# Patient Record
Sex: Male | Born: 1955
Health system: Southern US, Community
[De-identification: ages and names within clinical notes are randomized; demographics above are authoritative.]

## PROBLEM LIST (undated history)

## (undated) DIAGNOSIS — I10 Essential (primary) hypertension: Secondary | ICD-10-CM

## (undated) DIAGNOSIS — K219 Gastro-esophageal reflux disease without esophagitis: Secondary | ICD-10-CM

## (undated) DIAGNOSIS — M1990 Unspecified osteoarthritis, unspecified site: Secondary | ICD-10-CM

## (undated) DIAGNOSIS — A159 Respiratory tuberculosis unspecified: Secondary | ICD-10-CM

## (undated) HISTORY — DX: Unspecified osteoarthritis, unspecified site: M19.90

## (undated) HISTORY — DX: Respiratory tuberculosis unspecified: A15.9

## (undated) HISTORY — DX: Gastro-esophageal reflux disease without esophagitis: K21.9

## (undated) HISTORY — DX: Essential (primary) hypertension: I10

---

## 2007-05-29 ENCOUNTER — Encounter (INDEPENDENT_AMBULATORY_CARE_PROVIDER_SITE_OTHER): Payer: Self-pay | Admitting: *Deleted

## 2007-06-18 ENCOUNTER — Ambulatory Visit: Payer: Self-pay | Admitting: Internal Medicine

## 2007-06-18 DIAGNOSIS — I10 Essential (primary) hypertension: Secondary | ICD-10-CM | POA: Insufficient documentation

## 2007-06-25 ENCOUNTER — Ambulatory Visit: Payer: Self-pay | Admitting: Internal Medicine

## 2007-06-28 LAB — CONVERTED CEMR LAB
AST: 34 units/L (ref 0–37)
Basophils Relative: 1.2 % — ABNORMAL HIGH (ref 0.0–1.0)
CO2: 33 meq/L — ABNORMAL HIGH (ref 19–32)
Calcium: 9.3 mg/dL (ref 8.4–10.5)
Creatinine, Ser: 1 mg/dL (ref 0.4–1.5)
Eosinophils Relative: 4.7 % (ref 0.0–5.0)
GFR calc Af Amer: 102 mL/min
Glucose, Bld: 103 mg/dL — ABNORMAL HIGH (ref 70–99)
Hemoglobin: 15.1 g/dL (ref 13.0–17.0)
Lymphocytes Relative: 41.9 % (ref 12.0–46.0)
Monocytes Absolute: 0.5 10*3/uL (ref 0.2–0.7)
Neutro Abs: 2.3 10*3/uL (ref 1.4–7.7)
PSA: 0.53 ng/mL (ref 0.10–4.00)
Potassium: 3.7 meq/L (ref 3.5–5.1)
RDW: 12.7 % (ref 11.5–14.6)
TSH: 1.53 microintl units/mL (ref 0.35–5.50)
Total CHOL/HDL Ratio: 7.5
VLDL: 34 mg/dL (ref 0–40)
WBC: 5.3 10*3/uL (ref 4.5–10.5)

## 2007-07-17 ENCOUNTER — Ambulatory Visit: Payer: Self-pay | Admitting: Gastroenterology

## 2007-07-26 ENCOUNTER — Encounter: Payer: Self-pay | Admitting: Internal Medicine

## 2007-07-26 ENCOUNTER — Ambulatory Visit: Payer: Self-pay | Admitting: Gastroenterology

## 2007-09-23 ENCOUNTER — Ambulatory Visit: Payer: Self-pay | Admitting: Internal Medicine

## 2008-03-24 ENCOUNTER — Encounter (INDEPENDENT_AMBULATORY_CARE_PROVIDER_SITE_OTHER): Payer: Self-pay | Admitting: *Deleted

## 2008-04-29 ENCOUNTER — Ambulatory Visit: Payer: Self-pay | Admitting: Internal Medicine

## 2008-04-29 DIAGNOSIS — D239 Other benign neoplasm of skin, unspecified: Secondary | ICD-10-CM | POA: Insufficient documentation

## 2008-05-04 ENCOUNTER — Encounter (INDEPENDENT_AMBULATORY_CARE_PROVIDER_SITE_OTHER): Payer: Self-pay | Admitting: *Deleted

## 2008-06-03 ENCOUNTER — Ambulatory Visit: Payer: Self-pay | Admitting: Family Medicine

## 2008-06-03 DIAGNOSIS — L723 Sebaceous cyst: Secondary | ICD-10-CM | POA: Insufficient documentation

## 2008-06-03 LAB — CONVERTED CEMR LAB
Basophils Relative: 0.3 % (ref 0.0–3.0)
Eosinophils Absolute: 0.3 10*3/uL (ref 0.0–0.7)
Eosinophils Relative: 4.7 % (ref 0.0–5.0)
Lymphocytes Relative: 43.8 % (ref 12.0–46.0)
MCV: 95.8 fL (ref 78.0–100.0)
Monocytes Relative: 9.9 % (ref 3.0–12.0)
Neutrophils Relative %: 41.3 % — ABNORMAL LOW (ref 43.0–77.0)
Platelets: 230 10*3/uL (ref 150–400)
RBC: 4.75 M/uL (ref 4.22–5.81)
WBC: 5.6 10*3/uL (ref 4.5–10.5)

## 2008-06-04 ENCOUNTER — Encounter (INDEPENDENT_AMBULATORY_CARE_PROVIDER_SITE_OTHER): Payer: Self-pay | Admitting: *Deleted

## 2008-07-03 ENCOUNTER — Ambulatory Visit: Payer: Self-pay | Admitting: Internal Medicine

## 2008-07-03 DIAGNOSIS — M25559 Pain in unspecified hip: Secondary | ICD-10-CM

## 2008-07-03 DIAGNOSIS — G8929 Other chronic pain: Secondary | ICD-10-CM | POA: Insufficient documentation

## 2008-07-05 LAB — CONVERTED CEMR LAB
AST: 30 units/L (ref 0–37)
CO2: 34 meq/L — ABNORMAL HIGH (ref 19–32)
Calcium: 9.3 mg/dL (ref 8.4–10.5)
Chloride: 105 meq/L (ref 96–112)
Glucose, Bld: 98 mg/dL (ref 70–99)
HDL: 30.3 mg/dL — ABNORMAL LOW (ref >39.00)
LDL Cholesterol: 144 mg/dL — ABNORMAL HIGH (ref 0–99)
PSA: 0.54 ng/mL (ref 0.10–4.00)
Potassium: 4.4 meq/L (ref 3.5–5.1)
Sodium: 142 meq/L (ref 135–145)
Total CHOL/HDL Ratio: 7
Triglycerides: 120 mg/dL (ref 0.0–149.0)
VLDL: 24 mg/dL (ref 0.0–40.0)

## 2008-07-06 ENCOUNTER — Encounter (INDEPENDENT_AMBULATORY_CARE_PROVIDER_SITE_OTHER): Payer: Self-pay | Admitting: *Deleted

## 2008-11-13 ENCOUNTER — Telehealth (INDEPENDENT_AMBULATORY_CARE_PROVIDER_SITE_OTHER): Payer: Self-pay | Admitting: *Deleted

## 2009-06-29 ENCOUNTER — Telehealth: Payer: Self-pay | Admitting: Gastroenterology

## 2009-12-16 ENCOUNTER — Ambulatory Visit: Payer: Self-pay | Admitting: Genetic Counselor

## 2010-05-10 NOTE — Progress Notes (Signed)
Summary: Change recall  Phone Note From Other Clinic   Summary of Call: Dr. Celene Skeen called to inform me that this patient has a brother with colon cancer at age 52. We will change his screening recall to 5 years, 07/2012. Initial call taken by: Claudette Head, MD  Follow-up for Phone Call        Changed recall in IDX and in EMR.  Follow-up by: Christie Nottingham CMA Duncan Dull),  June 29, 2009 1:47 PM     Appended Document: Change recall    Clinical Lists Changes  Observations: Added new observation of COLONNXTDUE: 07/2012 (06/29/2009 13:48)

## 2012-06-24 ENCOUNTER — Ambulatory Visit (INDEPENDENT_AMBULATORY_CARE_PROVIDER_SITE_OTHER): Payer: 59 | Admitting: Family Medicine

## 2012-06-24 ENCOUNTER — Ambulatory Visit: Payer: 59

## 2012-06-24 VITALS — BP 140/86 | HR 66 | Temp 98.3°F | Resp 16 | Ht 67.5 in | Wt 199.8 lb

## 2012-06-24 DIAGNOSIS — R059 Cough, unspecified: Secondary | ICD-10-CM

## 2012-06-24 DIAGNOSIS — R05 Cough: Secondary | ICD-10-CM

## 2012-06-24 DIAGNOSIS — J209 Acute bronchitis, unspecified: Secondary | ICD-10-CM

## 2012-06-24 LAB — POCT CBC
Granulocyte percent: 39.3 %G (ref 37–80)
HCT, POC: 43.9 % (ref 43.5–53.7)
Hemoglobin: 14.2 g/dL (ref 14.1–18.1)
Lymph, poc: 3.1 (ref 0.6–3.4)
MCHC: 32.3 g/dL (ref 31.8–35.4)
MPV: 9 fL (ref 0–99.8)
POC Granulocyte: 2.4 (ref 2–6.9)
POC MID %: 8.5 %M (ref 0–12)

## 2012-06-24 MED ORDER — ALBUTEROL SULFATE HFA 108 (90 BASE) MCG/ACT IN AERS: 2.0000 | INHALATION_SPRAY | RESPIRATORY_TRACT | Status: DC | PRN

## 2012-06-24 MED ORDER — AZITHROMYCIN 250 MG PO TABS: ORAL_TABLET | ORAL | Status: DC

## 2012-06-24 MED ORDER — HYDROCODONE-HOMATROPINE 5-1.5 MG/5ML PO SYRP: 5.0000 mL | ORAL_SOLUTION | Freq: Four times a day (QID) | ORAL | Status: DC | PRN

## 2012-06-24 NOTE — Progress Notes (Signed)
  Subjective:    Patient ID: Aaron Bowman, male    DOB: [birth year], 57 y.o.   MRN: [id number]  HPI   Lots of sweats, no chills, unsure about fevers. No SHoB, wheeze or stridor.  A lot of nasal congestion, lots of sore thraot.  Using any otc meds wife gives him.       Review of Systems     Objective:   Physical Exam  Pulmonary/Chest: No accessory muscle usage. Not tachypneic. No respiratory distress. He has no decreased breath sounds. He has wheezes in the right lower field and the left lower field. He has rhonchi in the right upper field and the right lower field. He has rales in the right lower field and the left lower field.      Results for orders placed in visit on 06/24/12  POCT CBC      Result Value Range   WBC 6.0  4.6 - 10.2 K/uL   Lymph, poc 3.1  0.6 - 3.4   POC LYMPH PERCENT 52.2 (*) 10 - 50 %L   MID (cbc) 0.5  0 - 0.9   POC MID % 8.5  0 - 12 %M   POC Granulocyte 2.4  2 - 6.9   Granulocyte percent 39.3  37 - 80 %G   RBC 4.55 (*) 4.69 - 6.13 M/uL   Hemoglobin 14.2  14.1 - 18.1 g/dL   HCT, POC 40.9  81.1 - 53.7 %   MCV 96.4  80 - 97 fL   MCH, POC 31.2  27 - 31.2 pg   MCHC 32.3  31.8 - 35.4 g/dL   RDW, POC 91.4     Platelet Count, POC 237  142 - 424 K/uL   MPV 9.0  0 - 99.8 fL    [facility] reading (PRIMARY) by  Dr. Clelia Croft.  Possible small amount of left lower lobe consolidation overlying heart.  Assessment & Plan:  Cough - Plan: DG Chest 2 View, POCT CBC  Acute bronchitis  Meds ordered this encounter  Medications  . lisinopril-hydrochlorothiazide (PRINZIDE,ZESTORETIC) 20-25 MG per tablet    Sig: Take 1 tablet by mouth daily.  Marland Kitchen azithromycin (ZITHROMAX) 250 MG tablet    Sig: Take 2 tabs PO x 1 dose, then 1 tab PO QD x 4 days    Dispense:  6 tablet    Refill:  0  . albuterol (PROVENTIL HFA;VENTOLIN HFA) 108 (90 BASE) MCG/ACT inhaler    Sig: Inhale 2 puffs into the lungs every 4 (four) hours as needed for wheezing (cough, shortness of breath or wheezing.).   Dispense:  1 Inhaler    Refill:  1  . HYDROcodone-homatropine (HYCODAN) 5-1.5 MG/5ML syrup    Sig: Take 5 mLs by mouth every 6 (six) hours as needed for cough.    Dispense:  180 mL    Refill:  0

## 2012-06-24 NOTE — Patient Instructions (Addendum)

## 2012-07-18 ENCOUNTER — Encounter: Payer: Self-pay | Admitting: Gastroenterology

## 2014-07-07 ENCOUNTER — Other Ambulatory Visit (HOSPITAL_COMMUNITY): Payer: Self-pay | Admitting: Orthopedic Surgery

## 2014-07-07 DIAGNOSIS — M25561 Pain in right knee: Secondary | ICD-10-CM

## 2014-07-13 ENCOUNTER — Ambulatory Visit (HOSPITAL_COMMUNITY)
Admission: RE | Admit: 2014-07-13 | Discharge: 2014-07-13 | Disposition: A | Discharge status: Home / Self Care | Payer: 59 | Source: Ambulatory Visit | Attending: Orthopedic Surgery | Admitting: Orthopedic Surgery

## 2014-07-13 DIAGNOSIS — M6281 Muscle weakness (generalized): Secondary | ICD-10-CM | POA: Insufficient documentation

## 2014-07-13 DIAGNOSIS — M25561 Pain in right knee: Secondary | ICD-10-CM | POA: Diagnosis present

## 2015-04-08 ENCOUNTER — Ambulatory Visit (INDEPENDENT_AMBULATORY_CARE_PROVIDER_SITE_OTHER): Payer: 59 | Admitting: Family Medicine

## 2015-04-08 VITALS — BP 150/100 | HR 93 | Temp 99.4°F | Resp 17 | Ht 68.0 in | Wt 201.8 lb

## 2015-04-08 DIAGNOSIS — R5381 Other malaise: Secondary | ICD-10-CM

## 2015-04-08 DIAGNOSIS — J0110 Acute frontal sinusitis, unspecified: Secondary | ICD-10-CM

## 2015-04-08 DIAGNOSIS — R509 Fever, unspecified: Secondary | ICD-10-CM | POA: Diagnosis not present

## 2015-04-08 DIAGNOSIS — R05 Cough: Secondary | ICD-10-CM

## 2015-04-08 DIAGNOSIS — J3489 Other specified disorders of nose and nasal sinuses: Secondary | ICD-10-CM

## 2015-04-08 DIAGNOSIS — R059 Cough, unspecified: Secondary | ICD-10-CM

## 2015-04-08 LAB — POCT INFLUENZA A/B
Influenza A, POC: NEGATIVE
Influenza B, POC: NEGATIVE

## 2015-04-08 MED ORDER — CEFDINIR 300 MG PO CAPS: 300.0000 mg | ORAL_CAPSULE | Freq: Two times a day (BID) | ORAL | Status: DC

## 2015-04-08 MED ORDER — HYDROCODONE-HOMATROPINE 5-1.5 MG/5ML PO SYRP: 5.0000 mL | ORAL_SOLUTION | Freq: Three times a day (TID) | ORAL | Status: DC | PRN

## 2015-04-08 NOTE — Patient Instructions (Signed)
It appears that you have a sinus infection We are going to treat you with omnicef- this is an antibiotic that treats both sinus and chest infections Also use the cough syrup as needed- remember this will make you sleepy so do not use if you need to drive!

## 2015-04-08 NOTE — Progress Notes (Signed)
[facility] [address] [place] [address] [phone]  Date:  04/08/2015   Name:  Aaron Bowman   DOB:  February 12, 1956   MRN:  [id number]  PCP:  Kathlene November, MD    Chief Complaint: Facial Pain; Cough; and Flu Vaccine   History of Present Illness:  Aaron Bowman is a 59 y.o. very pleasant male patient who presents with the following:  Established pt here today with illness- he notes a sinus headache, cough.  Fatigue, he has not noted a fever at home.   He has not noted any body aches.   He has noted some chills.  No GI symptoms   He is generally in good health except for HTN- he has been using sudafed which is likely why his BP is up today  BP Readings from Last 3 Encounters:  04/08/15 150/100  06/24/12 140/86  07/03/08 140/92     Patient Active Problem List   Diagnosis Date Noted  . HIP PAIN, RIGHT 07/03/2008  . SEBACEOUS CYST, NECK 06/03/2008  . MOLE 04/29/2008  . HYPERTENSION 06/18/2007    History reviewed. No pertinent past medical history.  History reviewed. No pertinent past surgical history.  Social History  Substance Use Topics  . Smoking status: Never Smoker   . Smokeless tobacco: None  . Alcohol Use: None    History reviewed. No pertinent family history.  No Known Allergies  Medication list has been reviewed and updated.  Current Outpatient Prescriptions on File Prior to Visit  Medication Sig Dispense Refill  . lisinopril-hydrochlorothiazide (PRINZIDE,ZESTORETIC) 20-25 MG per tablet Take 1 tablet by mouth daily.    Marland Kitchen albuterol (PROVENTIL HFA;VENTOLIN HFA) 108 (90 BASE) MCG/ACT inhaler Inhale 2 puffs into the lungs every 4 (four) hours as needed for wheezing (cough, shortness of breath or wheezing.). (Patient not taking: Reported on 04/08/2015) 1 Inhaler 1  . azithromycin (ZITHROMAX) 250 MG tablet Take 2 tabs PO x 1 dose, then 1 tab PO QD x 4 days (Patient not taking: Reported on 04/08/2015) 6 tablet 0  . HYDROcodone-homatropine  (HYCODAN) 5-1.5 MG/5ML syrup Take 5 mLs by mouth every 6 (six) hours as needed for cough. (Patient not taking: Reported on 04/08/2015) 180 mL 0   No current facility-administered medications on file prior to visit.    Review of Systems:  As per HPI- otherwise negative.   Physical Examination: Filed Vitals:   04/08/15 0809 04/08/15 0810  BP: 160/98 150/100  Pulse: 93   Temp: 99.4 F (37.4 C)   Resp: 17    Filed Vitals:   04/08/15 0809  Height: 5\' 8"  (1.727 m)  Weight: 201 lb 12.8 oz (91.536 kg)   Body mass index is 30.69 kg/(m^2). Ideal Body Weight: Weight in (lb) to have BMI = 25: 164.1  GEN: WDWN, NAD, Non-toxic, A & O x 3, overweight, appears mildly ill.  Mild fever HEENT: Atraumatic, Normocephalic. Neck supple. No masses, No LAD.  Bilateral TM wnl, oropharynx normal.  PEERL,EOMI.   Nasal cavity is congested and inflamed  Ears and Nose: No external deformity. CV: RRR, No M/G/R. No JVD. No thrill. No extra heart sounds. PULM: CTA B, no wheezes, crackles, rhonchi. No retractions. No resp. distress. No accessory muscle use. ABD: S, NT, ND EXTR: No c/c/e NEURO Normal gait.  PSYCH: Normally interactive. Conversant. Not depressed or anxious appearing.  Calm demeanor.   Results for orders placed or performed in visit on 04/08/15  POCT Influenza A/B  Result Value Ref  Range   Influenza A, POC Negative Negative   Influenza B, POC Negative Negative     Assessment and Plan: Acute frontal sinusitis, recurrence not specified - Plan: cefdinir (OMNICEF) 300 MG capsule  Fever, unspecified - Plan: POCT Influenza A/B  Cough - Plan: POCT Influenza A/B, HYDROcodone-homatropine (HYCODAN) 5-1.5 MG/5ML syrup  Malaise - Plan: POCT Influenza A/B  Sinus pain - Plan: POCT Influenza A/B  Treat with omnicef and hycodan as needed Defer flu shot until well as he has a fever  Let me know if not feeling better soon Signed Lamar Blinks, MD

## 2015-08-25 DIAGNOSIS — E538 Deficiency of other specified B group vitamins: Secondary | ICD-10-CM | POA: Diagnosis not present

## 2015-08-25 DIAGNOSIS — Z0000 Encounter for general adult medical examination without abnormal findings: Secondary | ICD-10-CM | POA: Diagnosis not present

## 2015-08-25 DIAGNOSIS — Z125 Encounter for screening for malignant neoplasm of prostate: Secondary | ICD-10-CM | POA: Diagnosis not present

## 2015-08-31 DIAGNOSIS — H6121 Impacted cerumen, right ear: Secondary | ICD-10-CM | POA: Diagnosis not present

## 2015-08-31 DIAGNOSIS — M7711 Lateral epicondylitis, right elbow: Secondary | ICD-10-CM | POA: Diagnosis not present

## 2015-08-31 DIAGNOSIS — E785 Hyperlipidemia, unspecified: Secondary | ICD-10-CM | POA: Diagnosis not present

## 2015-08-31 DIAGNOSIS — E538 Deficiency of other specified B group vitamins: Secondary | ICD-10-CM | POA: Diagnosis not present

## 2015-08-31 DIAGNOSIS — I10 Essential (primary) hypertension: Secondary | ICD-10-CM | POA: Diagnosis not present

## 2015-08-31 DIAGNOSIS — Z0000 Encounter for general adult medical examination without abnormal findings: Secondary | ICD-10-CM | POA: Diagnosis not present

## 2015-09-21 DIAGNOSIS — M25462 Effusion, left knee: Secondary | ICD-10-CM | POA: Diagnosis not present

## 2015-09-21 DIAGNOSIS — S8002XA Contusion of left knee, initial encounter: Secondary | ICD-10-CM | POA: Diagnosis not present

## 2015-10-14 DIAGNOSIS — Z125 Encounter for screening for malignant neoplasm of prostate: Secondary | ICD-10-CM | POA: Diagnosis not present

## 2015-10-15 DIAGNOSIS — M25561 Pain in right knee: Secondary | ICD-10-CM | POA: Diagnosis not present

## 2015-10-15 DIAGNOSIS — M25461 Effusion, right knee: Secondary | ICD-10-CM | POA: Diagnosis not present

## 2015-10-19 DIAGNOSIS — N400 Enlarged prostate without lower urinary tract symptoms: Secondary | ICD-10-CM | POA: Diagnosis not present

## 2015-10-29 DIAGNOSIS — M25561 Pain in right knee: Secondary | ICD-10-CM | POA: Diagnosis not present

## 2015-11-02 DIAGNOSIS — S8002XD Contusion of left knee, subsequent encounter: Secondary | ICD-10-CM | POA: Diagnosis not present

## 2015-11-02 DIAGNOSIS — M25561 Pain in right knee: Secondary | ICD-10-CM | POA: Diagnosis not present

## 2015-11-17 DIAGNOSIS — H40023 Open angle with borderline findings, high risk, bilateral: Secondary | ICD-10-CM | POA: Diagnosis not present

## 2015-11-17 DIAGNOSIS — Z83511 Family history of glaucoma: Secondary | ICD-10-CM | POA: Diagnosis not present

## 2015-12-20 DIAGNOSIS — H40023 Open angle with borderline findings, high risk, bilateral: Secondary | ICD-10-CM | POA: Diagnosis not present

## 2015-12-20 DIAGNOSIS — Z83511 Family history of glaucoma: Secondary | ICD-10-CM | POA: Diagnosis not present

## 2015-12-21 DIAGNOSIS — S8002XD Contusion of left knee, subsequent encounter: Secondary | ICD-10-CM | POA: Diagnosis not present

## 2015-12-21 DIAGNOSIS — S83412D Sprain of medial collateral ligament of left knee, subsequent encounter: Secondary | ICD-10-CM | POA: Diagnosis not present

## 2016-01-06 ENCOUNTER — Ambulatory Visit (INDEPENDENT_AMBULATORY_CARE_PROVIDER_SITE_OTHER): Payer: 59 | Admitting: Urgent Care

## 2016-01-06 VITALS — BP 138/80 | HR 69 | Temp 98.5°F | Resp 16 | Ht 68.0 in | Wt 200.2 lb

## 2016-01-06 DIAGNOSIS — M545 Low back pain, unspecified: Secondary | ICD-10-CM

## 2016-01-06 DIAGNOSIS — M79604 Pain in right leg: Secondary | ICD-10-CM

## 2016-01-06 DIAGNOSIS — M62830 Muscle spasm of back: Secondary | ICD-10-CM

## 2016-01-06 DIAGNOSIS — Z8739 Personal history of other diseases of the musculoskeletal system and connective tissue: Secondary | ICD-10-CM

## 2016-01-06 MED ORDER — CYCLOBENZAPRINE HCL 5 MG PO TABS: 5.0000 mg | ORAL_TABLET | Freq: Three times a day (TID) | ORAL | 1 refills | Status: DC | PRN

## 2016-01-06 MED ORDER — NAPROXEN SODIUM 550 MG PO TABS: 550.0000 mg | ORAL_TABLET | Freq: Two times a day (BID) | ORAL | 1 refills | Status: DC

## 2016-01-06 NOTE — Progress Notes (Signed)
    MRN: [id number] DOB: 09/01/1955  Subjective:   Aaron Bowman is a 60 y.o. male presenting for chief complaint of Extremity Weakness (rt leg numb)  Reports 3 week history of right thigh numbness and tingling that travels through calf and foot. Has not tried any medications. Hydrates very well, 2+ liters of water daily. Diet is healthy. Patient works standing for an entire 8 hour shift, works 40 hour shifts, works indoors. Denies trauma, falls, redness, swelling, warmth, pain, cramp, back pain, heavy lifting.   Aaron Bowman has a current medication list which includes the following prescription(s): cholecalciferol, ferrous sulfate, lisinopril-hydrochlorothiazide, fish oil, vitamin b-12, vitamin e, and aspirin ec. Also has No Known Allergies.  Aaron Bowman  has no past medical history on file. Also  has no past surgical history on file.  Objective:   Vitals: BP 138/80 (BP Location: Right Arm, Patient Position: Sitting, Cuff Size: Normal)   Pulse 69   Temp 98.5 F (36.9 C) (Oral)   Resp 16   Ht '5\' 8"'$  (1.727 m)   Wt 200 lb 3.2 oz (90.8 kg)   SpO2 99%   BMI 30.44 kg/m   Physical Exam  Constitutional: He is oriented to person, place, and time. He appears well-developed and well-nourished.  Cardiovascular: Normal rate.   Pulmonary/Chest: Effort normal.  Musculoskeletal:       Lumbar back: He exhibits decreased range of motion (flexion), tenderness (over area depicted) and spasm (over bilateral lumbar paraspinal muscles). He exhibits no bony tenderness, no swelling, no edema, no deformity and no laceration.       Back:  Negative SLR.  Neurological: He is alert and oriented to person, place, and time.   Assessment and Plan :   1. Right leg pain 2. Right-sided low back pain without sciatica 3. Back spasm 4. History of scoliosis - Patient's symptom set is consistent with sciatica. I discussed obtaining labs such as CK, ESR, cbc, A1c as well as low back x-ray. He declined these for now, will use  conservative management including back stretches. Follow up in 2 weeks if no improvement. Consider PT, short steroid course, labs, x-ray, referral to ortho at that point.   Jaynee Eagles, PA-C [facility] [phone] 01/06/2016 4:43 PM

## 2016-01-06 NOTE — Patient Instructions (Addendum)
Ciática  °(Sciatica) ° La ciática es el dolor, debilidad, entumecimiento u hormigueo a lo largo del nervio ciático. El nervio comienza en la zona inferior de la espalda y desciende por la parte posterior de cada pierna. El nervio controla los músculos de la parte inferior de la pierna y de la zona posterior de la rodilla, y transmite la sensibilidad a la parte posterior del muslo, la pierna y la planta del pie. La ciática es un síntoma de otras afecciones médicas. Por ejemplo, un daño a los nervios o algunas enfermedades como un disco herniado o un espolón óseo en la columna vertebral, podrían dañarle o presionar en el nervio ciático. Esto causa dolor, debilidad y otras sensaciones normalmente asociadas con la ciática. Generalmente la ciática afecta sólo un lado del cuerpo. °CAUSAS  °· Disco herniado o desplazado. °· Enfermedad degenerativa del disco. °· Un síndrome doloroso que compromete un músculo angosto de los glúteos (síndrome piriforme). °· Lesión o fractura pélvica. °· Embarazo. °· Tumor (casos raros). °SÍNTOMAS  °Los síntomas pueden variar de leves a muy graves. Por lo general, los síntomas descienden desde la zona lumbar a las nalgas y la parte posterior de la pierna. Ellos son:  °· Hormigueo leve o dolor sordo en la parte inferior de la espalda, la pierna o la cadera. °· Adormecimiento en la parte posterior de la pantorrilla o la planta del pie. °· Sensación de quemazón en la zona lumbar, la pierna o la cadera. °· Dolor agudo en la zona inferior de la espalda, la pierna o la cadera. °· Debilidad en las piernas. °· Dolor de espalda intenso que inhibe los movimientos. °Los síntomas pueden empeorar al toser, estornudar, reír o estar sentado o parado durante mucho tiempo. Además, el sobrepeso puede empeorar los síntomas.  °DIAGNÓSTICO  °Su médico le hará un examen físico para buscar los síntomas comunes de la ciática. Le pedirá que haga algunos movimientos o actividades que activarían el dolor del nervio  ciático. Para encontrar las causas de la ciática podrá indicarle otros estudios. Estos pueden ser:  °· Análisis de sangre. °· Radiografías. °· Pruebas de diagnóstico por imágenes, como resonancia magnética o tomografía computada. °TRATAMIENTO  °El tratamiento se dirige a las causas de la ciática. A veces, el tratamiento no es necesario, y el dolor y el malestar desaparecen por sí mismos. Si necesita tratamiento, su médico puede sugerir:  °· Medicamentos de venta libre para aliviar el dolor. °· Medicamentos recetados, como antiinflamatorios, relajantes musculares o narcóticos. °· Aplicación de calor o hielo en la zona del dolor. °· Inyecciones de corticoides para disminuir el dolor, la irritación y la inflamación alrededor del nervio. °· Reducción de la actividad en los períodos de dolor. °· Ejercicios y estiramiento del abdomen para fortalecer y mejorar la flexibilidad de la columna vertebral. Su médico puede sugerirle perder peso si el peso extra empeora el dolor de espalda. °· Fisioterapia. °· La cirugía para eliminar lo que presiona o pincha el nervio, como un espolón óseo o parte de una hernia de disco. °INSTRUCCIONES PARA EL CUIDADO EN EL HOGAR  °· Sólo tome medicamentos de venta libre o recetados para calmar el dolor o el malestar, según las indicaciones de su médico. °· Aplique hielo sobre el área dolorida durante 20 minutos 3-4 veces por día durante los primeras 48-72 horas. Luego intente aplicar calor de la misma manera. °· Haga ejercicios, elongue o realice sus actividades habituales, si no le causan más dolor. °· Cumpla con todas las sesiones de fisioterapia, según le   indique su mdico.  Cumpla con todas las visitas de control, segn le indique su mdico.  No use tacones altos o zapatos que no tengan buen apoyo.  Verifique que el colchn no sea muy blando. Un colchn firme Best boy y las [place]. SOLICITE ATENCIN MDICA DE INMEDIATO SI:   Pierde el control de la vejiga o del intestino  (incontinencia).  Aumenta la debilidad en la zona inferior de la espalda, la pelvis, las nalgas o las piernas.  Siente irritacin o inflamacin en la espalda.  Tiene sensacin de ardor al [employer].  El dolor empeora cuando se acuesta o lo despierta por la noche.  El dolor es peor del que experiment en el pasado.  Dura ms de 4 semanas.  Pierde peso sin motivo de [place] sbita. ASEGRESE DE QUE:   Comprende estas instrucciones.  Controlar su enfermedad.  Solicitar ayuda de inmediato si no mejora o si empeora.   Esta informacin no tiene Marine scientist el consejo del mdico. Asegrese de hacerle al mdico cualquier pregunta que tenga.   Document Released: 03/27/2005 Document Revised: 12/16/2014 Elsevier Interactive Patient Education [employer].     IF you received an x-ray today, you will receive an invoice from [facility]. Please contact [facility] at [phone] with questions or concerns regarding your invoice.   IF you received labwork today, you will receive an invoice from [employer]. Please contact [facility] at [phone] with questions or concerns regarding your invoice.   Our billing staff will not be able to assist you with questions regarding bills from these companies.  You will be contacted with the lab results as soon as they are available. The fastest way to get your results is to activate your My Chart account. Instructions are located on the last page of this paperwork. If you have not heard from [place] regarding the results in 2 weeks, please contact this office.

## 2016-04-20 ENCOUNTER — Ambulatory Visit (INDEPENDENT_AMBULATORY_CARE_PROVIDER_SITE_OTHER): Payer: 59

## 2016-04-20 ENCOUNTER — Ambulatory Visit (INDEPENDENT_AMBULATORY_CARE_PROVIDER_SITE_OTHER): Payer: 59 | Admitting: Urgent Care

## 2016-04-20 VITALS — BP 130/88 | HR 78 | Temp 97.8°F | Resp 18 | Ht 68.0 in | Wt 202.0 lb

## 2016-04-20 DIAGNOSIS — M488X9 Other specified spondylopathies, site unspecified: Secondary | ICD-10-CM

## 2016-04-20 DIAGNOSIS — R200 Anesthesia of skin: Secondary | ICD-10-CM | POA: Diagnosis not present

## 2016-04-20 DIAGNOSIS — Z23 Encounter for immunization: Secondary | ICD-10-CM

## 2016-04-20 DIAGNOSIS — Z8739 Personal history of other diseases of the musculoskeletal system and connective tissue: Secondary | ICD-10-CM

## 2016-04-20 DIAGNOSIS — M5416 Radiculopathy, lumbar region: Secondary | ICD-10-CM

## 2016-04-20 DIAGNOSIS — R202 Paresthesia of skin: Secondary | ICD-10-CM

## 2016-04-20 DIAGNOSIS — M5136 Other intervertebral disc degeneration, lumbar region: Secondary | ICD-10-CM

## 2016-04-20 DIAGNOSIS — M545 Low back pain: Secondary | ICD-10-CM | POA: Diagnosis not present

## 2016-04-20 DIAGNOSIS — M47819 Spondylosis without myelopathy or radiculopathy, site unspecified: Secondary | ICD-10-CM

## 2016-04-20 NOTE — Progress Notes (Signed)
MRN: [id number] DOB: 1955-11-03  Subjective:   Aaron Bowman is a 61 y.o. male presenting for chief complaint of Follow-up (Leg pain, Right, Foot, ) and Flu Vaccine  Reports ongoing numbness and tingling throughout his right leg, worst over medial aspect of his right foot. He is now having similar symptoms to his left leg. Also has bilateral numbness and tingling of both his hands, has cramping of both hands and feet. Denies back pain but admits history of scoliosis. Also has urinary frequency, drinks about 1/2 liter per day. Stands for 8 hour shifts, 40 hours per week. Denies trauma, falls, hematuria, dysuria, n/v, abdominal pain, constipation, weight loss, weakness, incontinence. Admits family history of diabetes with his mother. Patient tries to eat healthily. Denies blurred vision, polydipsia, skin infections that won't heal. Denies smoking cigarettes.  Aaron Bowman has a current medication list which includes the following prescription(s): aspirin ec, cholecalciferol, cyclobenzaprine, ferrous sulfate, lisinopril-hydrochlorothiazide, naproxen sodium, fish oil, vitamin b-12, and vitamin e. Also has No Known Allergies.  Aaron Bowman  has no past medical history on file. Also  has no past surgical history on file.  Objective:   Vitals: BP 130/88   Pulse 78   Temp 97.8 F (36.6 C) (Oral)   Resp 18   Ht 5\' 8"  (1.727 m)   Wt 202 lb (91.6 kg)   SpO2 97%   BMI 30.71 kg/m   Physical Exam  Constitutional: He is oriented to person, place, and time. He appears well-developed and well-nourished.  HENT:  Mouth/Throat: Oropharynx is clear and moist.  Eyes: Pupils are equal, round, and reactive to light. No scleral icterus.  Cardiovascular: Normal rate, regular rhythm and intact distal pulses.  Exam reveals no gallop and no friction rub.   No murmur heard. Pulmonary/Chest: No respiratory distress. He has no wheezes. He has no rales.  Abdominal: Soft. Bowel sounds are normal. He exhibits no distension  and no mass. There is no tenderness. There is no guarding.  Musculoskeletal:       Right upper leg: He exhibits tenderness (reports painful tightness of hamstring with straight leg raise). He exhibits no bony tenderness, no swelling, no edema, no deformity and no laceration.       Left upper leg: He exhibits no tenderness, no bony tenderness, no swelling, no edema, no deformity and no laceration.       Right lower leg: He exhibits no tenderness, no bony tenderness, no swelling, no edema, no deformity and no laceration.       Right foot: There is normal range of motion, no tenderness, no bony tenderness, no swelling, normal capillary refill, no crepitus, no deformity and no laceration.  Neurological: He is alert and oriented to person, place, and time. He displays normal reflexes. Coordination normal.  Skin: Skin is warm and dry.   Dg Lumbar Spine Complete  Result Date: 04/20/2016 CLINICAL DATA:  Low back pain for 3 months. EXAM: LUMBAR SPINE - COMPLETE 4+ VIEW COMPARISON:  None. FINDINGS: There is no evidence of lumbar spine fracture. Alignment is normal. There are degenerative joint changes with facet joint sclerosis of the mid to lower lumbar spine. Anterior osteophytosis is identified at L3. IMPRESSION: No acute fracture dislocation. Degenerative joint changes of lumbar spine. Electronically Signed   By: Abelardo Diesel M.D.   On: 04/20/2016 17:41   Assessment and Plan :   1. Lumbar radiculopathy 2. Numbness and tingling 3. Facet joint sclerosis ([place]) 4. Degenerative disc disease, lumbar 5. History of scoliosis -  I counseled patient on conservative measures for his symptoms. He declined PT for now. He would like to schedule APAP, use meloxicam for breakthrough pain. He plans on rtc if his symptoms persist. Consider steroid course, referral to PT or ortho. Labs pending.  6. Need for influenza vaccination - Flu Vaccine QUAD 36+ mos IM   Jaynee Eagles, [facility] [phone] 04/20/2016  4:57 PM

## 2016-04-20 NOTE — Patient Instructions (Addendum)
Tylenol Tome 500mg  cada 6 horas o tome 1,000mg  cada 8 horas para dolor y inflammacion associado con artritis.    Discopata degenerativa (Degenerative Disk Disease) La discopata degenerativa es una enfermedad causada por los cambios que se producen en los discos intervertebrales a medida que una persona envejece. Los discos intervertebrales son blandos y comprimibles, y se encuentran entre los huesos de la columna (vrtebras). Estos discos actan como amortiguadores de los [place]. La discopata degenerativa puede comprometer a toda la columna vertebral. Sin embargo, el cuello y parte inferior de la espalda son las zonas ms comnmente afectadas. Al envejecer, pueden producirse muchos cambios en los discos intervertebrales, por ejemplo:  Pueden secarse y encogerse.  Pueden formarse pequeos desgarros en el recubrimiento exterior duro del disco (anillo).  El espacio intervertebral puede reducirse debido a la prdida de [place].  Pueden desarrollarse crecimientos anormales en el hueso (espolones). Esto puede ejercer presin [employer] races nerviosas que salen del canal espinal y Engineer, production.  El canal espinal puede estrecharse. FACTORES DE RIESGO  Tener sobrepeso.  Tener antecedentes familiares de discopata degenerativa.  Fumar.  El riesgo es mayor si suele levantar objetos pesados o sufre una lesin repentina. [place] persona a otra y pueden incluir los siguientes:  Dolor de intensidad variable. Algunas personas no tienen dolor, mientras que otras sufren un dolor intenso. La ubicacin del dolor depende de la zona de la columna vertebral que est afectada.  Si se trata de un disco que se encuentra cerca de la zona del cuello, tendr dolor de cuello o del brazo.  Si la zona afectada es la parte inferior de la espalda, tendr dolor de [place], de los glteos o las piernas.  Dolor que empeora al agacharse, [employer] brazos o Optometrist  movimientos de torsin.  Dolor que puede comenzar de [place] gradual y Film/video editor con el paso del [place]. Tambin puede aparecer despus de sufrir una lesin leve o importante.  Hormigueo o adormecimiento de los brazos o las piernas. DIAGNSTICO El mdico le preguntar cules son sus sntomas y Estate manager/land agent las actividades y los hbitos que pueden causar Conservation officer, historic buildings. [place], [employer] y las enfermedades que tuvo o los tratamientos que recibi. El [employer] har un examen para determinar el rango de movimiento posible de la zona afectada, controlar la fuerza que tiene en las extremidades, as Therapist, occupational sensibilidad en las zonas de los brazos y las piernas inervadas por diferentes races nerviosas. Tambin se Educational psychologist los siguientes estudios:  Una radiografa de la columna.  Otros estudios por imgenes, como una RM. TRATAMIENTO El Sport and exercise psychologist cul es el mejor plan de tratamiento. El tratamiento puede incluir lo siguiente:  Medicamentos.  Ejercicios de rehabilitacin. INSTRUCCIONES PARA EL CUIDADO EN EL HOGAR  Siga las tcnicas adecuadas para caminar y Lexicographer objetos pesados como se lo haya recomendado el mdico.  Mantenga una buena [place].  Haga ejercicios regularmente como se lo haya recomendado el [place] de relajacin.  Cambie los hbitos para sentarse, ponerse de pie y dormir como se lo haya recomendado el mdico.  Cambie frecuentemente de posicin.  Mantenga un peso saludable o baje de peso como se lo haya recomendado el mdico.  No consuma ningn producto que contenga tabaco, lo que incluye cigarrillos, tabaco de Higher education careers adviser o Psychologist, sport and exercise. Si necesita ayuda para dejar de fumar, consulte al mdico.  Use calzado que tenga el soporte correcto.  Tome los medicamentos solamente como  se lo haya indicado el mdico. SOLICITE ATENCIN MDICA SI:  El dolor no desaparece en el trmino de 1 a 4semanas.  Pierde drsticamente de peso o  el apetito. SOLICITE ATENCIN MDICA DE INMEDIATO SI:  El dolor es intenso.  Tiene [employer], las manos o las piernas.  Comienza a perder el control de la vejiga o los intestinos.  Tiene fiebre o sudores nocturnos. ASEGRESE DE QUE:  Comprende estas instrucciones.  Controlar su afeccin.  Recibir ayuda de inmediato si no mejora o si empeora. Esta informacin no tiene Marine scientist el consejo del mdico. Asegrese de hacerle al mdico cualquier pregunta que tenga. Document Released: 07/13/2008 Document Revised: 04/17/2014 Document Reviewed: 07/29/2013 Elsevier Interactive Patient Education  2017 [place].    Meloxicam tablets Qu es [place] medicamento? El MELOXICAM es un medicamento antiinflamatorio no esteroideo (AINE). Se utiliza para reducir la inflamacin y Regulatory affairs officer. Este medicamento se puede utilizar tambin para la osteoartritis, la artritis reumatoidea o la artritis reumatoidea juvenil. Este medicamento puede ser utilizado para otros usos; si tiene alguna pregunta consulte con su proveedor de atencin mdica o con su farmacutico. MARCAS COMUNES: Mobic Qu le debo informar a mi profesional de la salud antes de tomar este medicamento? Necesitan saber si usted presenta alguno de los siguientes problemas o situaciones: trastornos de sangrado fuma ciruga de bypass coronario con injerto en las ltimas 2 semanas consume ms de 3 bebidas alcohlicas por da enfermedad cardiaca alta presin sangunea antecedentes de sangrado estomacal enfermedad renal enfermedad heptica enfermedad pulmonar o respiratoria, como asma problemas estomacales o intestinales una reaccin alrgica o inusual al meloxicam, a la aspirina, a otros AINE, otros medicamentos, alimentos, colorantes o conservantes est embarazada o buscando quedar embarazada est amamantando a un beb Cmo debo Insurance account manager medicamento? Tome este medicamento por va oral con un vaso lleno de agua.  Siga las instrucciones de la etiqueta del [place]. Puede tomarlo con o sin alimentos. Si el Transport planner, tmelo con alimentos. Tome su medicamento a intervalos regulares. No lo tome con una frecuencia mayor a la indicada. No deje de tomarlo, excepto si as lo indica su mdico. Su farmacutico le dar una [place] del medicamento especial (MedGuide, su nombre en ingls) con cada receta y en cada ocasin que la vuelva a surtir. Asegrese de leer esta informacin cada vez cuidadosamente. Hable con su pediatra para informarse acerca del uso de este medicamento en nios. Aunque este medicamento se puede Advertising account executive, existen precauciones que deben cumplirse. Los pacientes de ms de 65 aos de edad pueden presentar reacciones ms fuertes y Designer, industrial/product dosis [employer]. Sobredosis: Pngase en contacto inmediatamente con un centro toxicolgico o una sala de urgencia si usted cree que haya tomado demasiado medicamento. ATENCIN: [employer] es solo para usted. No comparta este medicamento con nadie. Qu sucede si me olvido de una dosis? Si olvida una dosis, tmela lo antes posible. Si es casi la hora de su dosis siguiente, tome slo esa dosis. No tome dosis adicionales o dobles. Qu puede interactuar con este medicamento? No tome esta medicina con ninguno de los siguientes medicamentos: cidofovir ketorolac Esta medicina tambin puede interactuar con los siguientes medicamentos: aspirina y medicamentos tipo aspirina ciertos medicamentos para la presin sangunea, enfermedad cardiaca, pulso cardiaco irregular ciertos medicamentos para la depresin, ansiedad o trastornos psicticos ciertos medicamentos que tratan o previenen cogulos sanguneos, como warfarina, enoxaparina, dalteparina, apixaban, dabigatran, rivaroxaban ciclosporina digoxina diurticos metotrexato otros AINE, medicamentos para Conservation officer, historic buildings  e inflamacin, como ibuprofeno y naproxeno pemetrexed Puede ser que  esta lista no menciona todas las posibles interacciones. Informe a su profesional de [employer]	[place] de [employer] productos a base de hierbas, medicamentos de [place] o suplementos nutritivos que est tomando. Si usted fuma, consume bebidas alcohlicas o si utiliza drogas ilegales, indqueselo tambin a su profesional de [employer]	[place]. Algunas sustancias pueden interactuar con su medicamento. A qu debo estar atento al usar Coca-Cola? Informe a su mdico o su profesional de la salud si sus sntomas no comienzan a mejorar o si empeoran. Evite tomar otros medicamentos que contienen aspirina, ibuprofeno o naproxeno con Coca-Cola. Es ms probable que se produzcan efectos secundarios, tales como molestias estomacales, nuseas o lceras. No debe de tomar Coca-Cola con muchos medicamentos disponibles de [employer]. Este medicamento puede causar lceras y sangrado en el estmago e intestinos en cualquier momento durante el tratamiento. Esto puede suceder sin aviso y puede causar la muerte. El riesgo es mayor cuando se toma este medicamento durante [place]. Fumar, beber alcohol, ser de edad avanzada y [place] salud tambin pueden aumentar los riesgos. Llame a su mdico de inmediato si tiene dolor de estmago o sangre en su vmito o heces. Este medicamento no previene ataques al corazn o derrames cerebrales. De hecho, este medicamento puede aumentar la posibilidad de Insurance risk surveyor un ataque cardiaco o un derrame cerebral. La posibilidad puede aumentar con el uso prolongado de este medicamento y en pacientes con enfermedad cardiaca. Si est tomando aspirina para la prevencin de ataques cardiacos o derrames cerebrales, hable con su mdico o su profesional de [employer]	[place]. Qu efectos secundarios puedo tener al [employer] este medicamento? Efectos secundarios que debe informar a su mdico o a Barrister's clerk de la salud tan pronto como sea posible: Chief of Staff, como erupcin cutnea, picazn o  urticarias, e hinchazn de la cara, los labios o la lengua nuseas, vmito signos y sntomas de un cogulo sanguneo, tales como problemas respiratorios; cambios en la visin; dolor en el pecho; dolor de cabeza severo, repentino; dolor, hinchazn, calor en la pierna; dificultad para hablar; entumecimiento o debilidad repentina de la cara, el brazo o la pierna signos y sntomas de Teacher, music, tales como heces con Biochemist, clinical o de color negro y Curator alquitranado; [place] de color rojo o Forensic psychologist oscuro; escupir sangre o Sales executive que tiene el aspecto de granos de caf molido; Tree surgeon rojas en la piel; sangrado o moretones inusuales en los ojos, las encas o la nariz signos y sntomas de lesin al hgado, como orina amarilla oscura o Forensic psychologist; sensacin general de estar enfermo o sntomas gripales; heces claras; prdida de apetito; nuseas; dolor en la regin abdominal superior derecha; cansancio o debilidad inusual; color amarillento de los ojos o la piel signos y sntomas de un derrame cerebral, tales como cambios en la visin; confusin; dificultad para hablar o entender; dolores de cabeza severos; entumecimiento o debilidad repentina de la cara, el brazo o la pierna; dificultad para caminar; Tree surgeon; prdida del equilibrio o la coordinacin Efectos secundarios que generalmente no requieren Geophysical data processor (infrmelos a su mdico o a Barrister's clerk de la salud si persisten o si son molestos): estreimiento diarrea gases Puede ser que esta lista no menciona todos los posibles efectos secundarios. Comunquese a su mdico por asesoramiento mdico [employer]. Usted puede informar los efectos secundarios a la FDA por telfono al 1-800-FDA-1088. Dnde debo guardar mi medicina? Mantngala fuera del alcance de los nios. Gurdela a  temperatura ambiente, entre 15 y 74 grados C (104 y 75 grados F). Deseche todo el medicamento que no haya utilizado, despus de la fecha de vencimiento. ATENCIN: Este  folleto es un resumen. Puede ser que no cubra toda la posible informacin. Si usted tiene preguntas acerca de esta medicina, consulte con su mdico, su farmacutico o su profesional de Technical sales engineer.  2017 Elsevier/Gold Standard (2015-07-12 00:00:00)    IF you received an x-ray today, you will receive an invoice from [facility] Rad[facility]. Please contact [facility] at [phone] with questions or concerns regarding your invoice.   IF you received labwork today, you will receive an invoice from [place]. Please contact [facility] at [phone] with questions or concerns regarding your invoice.   Our billing staff will not be able to assist you with questions regarding bills from these companies.  You will be contacted with the lab results as soon as they are available. The fastest way to get your results is to activate your My Chart account. Instructions are located on the last page of this paperwork. If you have not heard from [place] regarding the results in 2 weeks, please contact this office.

## 2016-04-21 LAB — COMPREHENSIVE METABOLIC PANEL
ALT: 36 IU/L (ref 0–44)
AST: 32 IU/L (ref 0–40)
Albumin/Globulin Ratio: 1.3 (ref 1.2–2.2)
Albumin: 4.6 g/dL (ref 3.5–5.5)
Alkaline Phosphatase: 67 IU/L (ref 39–117)
BUN / CREAT RATIO: 14 (ref 9–20)
BUN: 14 mg/dL (ref 6–24)
Bilirubin Total: 0.3 mg/dL (ref 0.0–1.2)
CHLORIDE: 96 mmol/L (ref 96–106)
CO2: 26 mmol/L (ref 18–29)
Calcium: 9.4 mg/dL (ref 8.7–10.2)
Creatinine, Ser: 1 mg/dL (ref 0.76–1.27)
GFR calc non Af Amer: 83 mL/min/{1.73_m2} (ref >59)
GFR, EST AFRICAN AMERICAN: 95 mL/min/{1.73_m2} (ref >59)
Globulin, Total: 3.5 g/dL (ref 1.5–4.5)
Glucose: 88 mg/dL (ref 65–99)
Potassium: 3.9 mmol/L (ref 3.5–5.2)
Sodium: 140 mmol/L (ref 134–144)
TOTAL PROTEIN: 8.1 g/dL (ref 6.0–8.5)

## 2016-04-21 LAB — CBC
HEMATOCRIT: 46.8 % (ref 37.5–51.0)
Hemoglobin: 15.9 g/dL (ref 13.0–17.7)
MCH: 32.4 pg (ref 26.6–33.0)
MCHC: 34 g/dL (ref 31.5–35.7)
MCV: 96 fL (ref 79–97)
Platelets: 230 10*3/uL (ref 150–379)
RBC: 4.9 x10E6/uL (ref 4.14–5.80)
RDW: 13.9 % (ref 12.3–15.4)
WBC: 7.4 10*3/uL (ref 3.4–10.8)

## 2016-04-21 LAB — HEMOGLOBIN A1C
ESTIMATED AVERAGE GLUCOSE: 108 mg/dL
Hgb A1c MFr Bld: 5.4 % (ref 4.8–5.6)

## 2016-05-04 ENCOUNTER — Telehealth: Payer: Self-pay

## 2016-05-04 DIAGNOSIS — M62830 Muscle spasm of back: Secondary | ICD-10-CM

## 2016-05-04 DIAGNOSIS — M545 Low back pain, unspecified: Secondary | ICD-10-CM

## 2016-05-04 NOTE — Telephone Encounter (Signed)
Pt states he saw mani on 04/20/16 and the meds he prescribed has never gotten to the pharmacy and he needs them sent  [place] number [phone]

## 2016-05-04 NOTE — Telephone Encounter (Signed)
I dont see a med ordered 04/20/16?

## 2016-05-05 MED ORDER — MELOXICAM 7.5 MG PO TABS: 7.5000 mg | ORAL_TABLET | Freq: Every day | ORAL | 1 refills | Status: DC

## 2016-05-05 NOTE — Telephone Encounter (Signed)
Patient was expecting meloxicam for his pain. I do recall discussing this with him in clinic and is in my note. I did not send the script at his last office visit. I apologized and recommended he rtc if this pain persists, consider steroid course and/or referral to ortho.

## 2016-08-09 ENCOUNTER — Encounter: Payer: Self-pay | Admitting: Emergency Medicine

## 2016-08-09 ENCOUNTER — Ambulatory Visit (INDEPENDENT_AMBULATORY_CARE_PROVIDER_SITE_OTHER): Payer: 59 | Admitting: Emergency Medicine

## 2016-08-09 VITALS — BP 140/88 | HR 67 | Temp 98.5°F | Resp 16 | Ht 67.75 in | Wt 193.2 lb

## 2016-08-09 DIAGNOSIS — M2550 Pain in unspecified joint: Secondary | ICD-10-CM | POA: Diagnosis not present

## 2016-08-09 DIAGNOSIS — Z0000 Encounter for general adult medical examination without abnormal findings: Secondary | ICD-10-CM | POA: Diagnosis not present

## 2016-08-09 DIAGNOSIS — I10 Essential (primary) hypertension: Secondary | ICD-10-CM | POA: Diagnosis not present

## 2016-08-09 LAB — POCT URINALYSIS DIP (MANUAL ENTRY)
Bilirubin, UA: NEGATIVE
GLUCOSE UA: NEGATIVE mg/dL
Ketones, POC UA: NEGATIVE mg/dL
Leukocytes, UA: NEGATIVE
Nitrite, UA: NEGATIVE
PROTEIN UA: NEGATIVE mg/dL
RBC UA: NEGATIVE
SPEC GRAV UA: 1.015 (ref 1.010–1.025)
UROBILINOGEN UA: 0.2 U/dL
pH, UA: 7.5 (ref 5.0–8.0)

## 2016-08-09 MED ORDER — LISINOPRIL-HYDROCHLOROTHIAZIDE 20-25 MG PO TABS: 1.0000 | ORAL_TABLET | Freq: Every day | ORAL | 3 refills | Status: DC

## 2016-08-09 MED ORDER — MELOXICAM 7.5 MG PO TABS: 7.5000 mg | ORAL_TABLET | Freq: Every day | ORAL | 1 refills | Status: DC

## 2016-08-09 NOTE — Assessment & Plan Note (Signed)
Compliant with medication. No changes.

## 2016-08-09 NOTE — Progress Notes (Signed)
Aaron Bowman 61 y.o.   Chief Complaint  Patient presents with  . Annual Exam    HISTORY OF PRESENT ILLNESS: This is a 61 y.o. male here today for annual examination.  HPI   Prior to Admission medications   Medication Sig Start Date End Date Taking? Authorizing Provider  aspirin EC 81 MG tablet Take 81 mg by mouth daily.   Yes Historical Provider, MD  cholecalciferol (VITAMIN D) 400 units TABS tablet Take 400 Units by mouth daily.   Yes Historical Provider, MD  cyclobenzaprine (FLEXERIL) 5 MG tablet Take 1 tablet (5 mg total) by mouth 3 (three) times daily as needed. 01/06/16  Yes Jaynee Eagles, PA-C  ferrous sulfate 325 (65 FE) MG EC tablet Take 325 mg by mouth 1 day or 1 dose.   Yes Historical Provider, MD  lisinopril-hydrochlorothiazide (PRINZIDE,ZESTORETIC) 20-25 MG per tablet Take 1 tablet by mouth daily.   Yes Historical Provider, MD  meloxicam (MOBIC) 7.5 MG tablet Take 1 tablet (7.5 mg total) by mouth daily. 05/05/16  Yes Jaynee Eagles, PA-C  Omega-3 Fatty Acids (FISH OIL) 1000 MG CAPS Take 1,000 mg by mouth.   Yes Historical Provider, MD  vitamin B-12 (CYANOCOBALAMIN) 100 MCG tablet Take 100 mcg by mouth daily.   Yes Historical Provider, MD  vitamin E 1000 UNIT capsule Take 1,000 Units by mouth daily.   Yes Historical Provider, MD    No Known Allergies  Patient Active Problem List   Diagnosis Date Noted  . Facet joint sclerosis ([place]) 04/20/2016  . HIP PAIN, RIGHT 07/03/2008  . SEBACEOUS CYST, NECK 06/03/2008  . MOLE 04/29/2008  . HYPERTENSION 06/18/2007    Past Medical History:  Diagnosis Date  . GERD (gastroesophageal reflux disease)   . Tuberculosis     No past surgical history on file.  Social History   Social History  . Marital status: Married    Spouse name: N/A  . Number of children: N/A  . Years of education: N/A   Occupational History  . Not on file.   Social History Main Topics  . Smoking status: Never Smoker  . Smokeless tobacco: Never Used    . Alcohol use 3.0 oz/week    5 Cans of beer per week  . Drug use: No  . Sexual activity: Not on file   Other Topics Concern  . Not on file   Social History Narrative  . No narrative on file    No family history on file.   Review of Systems  Constitutional: Negative.  Negative for chills, fever and weight loss.  HENT: Negative.  Negative for nosebleeds and sore throat.   Eyes: Negative.  Negative for blurred vision and double vision.  Respiratory: Negative.  Negative for cough and shortness of breath.   Cardiovascular: Negative.  Negative for chest pain and palpitations.  Gastrointestinal: Negative.  Negative for abdominal pain, blood in stool, diarrhea, melena, nausea and vomiting.  Genitourinary: Negative.  Negative for dysuria and hematuria.  Musculoskeletal: Positive for back pain and joint pain.       Numbness/tingling right big toe  Skin: Negative.  Negative for rash.  Neurological: Negative.  Negative for dizziness, focal weakness, seizures and headaches.  Endo/Heme/Allergies: Negative.   All other systems reviewed and are negative.    Physical Exam  Constitutional: He is oriented to person, place, and time. He appears well-developed and well-nourished.  HENT:  Head: Normocephalic and atraumatic.  Right Ear: External ear normal.  Left Ear: External ear  normal.  Nose: Nose normal.  Mouth/Throat: Oropharynx is clear and moist. No oropharyngeal exudate.  Eyes: Conjunctivae and EOM are normal. Pupils are equal, round, and reactive to light.  Neck: Normal range of motion. Neck supple. No JVD present. No thyromegaly present.  Cardiovascular: Normal rate, regular rhythm, normal heart sounds and intact distal pulses.   Pulmonary/Chest: Effort normal and breath sounds normal.  Abdominal: Soft. Bowel sounds are normal. He exhibits no distension and no mass. There is no tenderness. No hernia.  Musculoskeletal: Normal range of motion.  Back: WNL Extremities: WNL FROM Right  big toe: NVI no abnormal findings.  Lymphadenopathy:    He has no cervical adenopathy.  Neurological: He is alert and oriented to person, place, and time. He displays normal reflexes. No sensory deficit. He exhibits normal muscle tone.  Skin: Skin is warm and dry. Capillary refill takes less than 2 seconds. No rash noted.  Psychiatric: He has a normal mood and affect. His behavior is normal.  Vitals reviewed.    ASSESSMENT & PLAN: Aaron Bowman was seen today for annual exam.  Diagnoses and all orders for this visit:  Routine general medical examination at a health care facility -     meloxicam (MOBIC) 7.5 MG tablet; Take 1 tablet (7.5 mg total) by mouth daily. -     CBC with Differential -     Comprehensive metabolic panel -     Hemoglobin A1c -     Lipid panel -     PSA(Must document that pt has been informed of limitations of PSA testing.) -     TSH -     Hepatitis C antibody screen -     HIV antibody  Essential hypertension -     lisinopril-hydrochlorothiazide (PRINZIDE,ZESTORETIC) 20-25 MG tablet; Take 1 tablet by mouth daily. -     POCT urinalysis dipstick  Arthralgia, unspecified joint -     ANA,IFA RA Diag Pnl w/rflx Tit/Patn    Patient Instructions       IF you received an x-ray today, you will receive an invoice from [facility]. Please contact [facility] at [phone] with questions or concerns regarding your invoice.   IF you received labwork today, you will receive an invoice from [facility]. Please contact [facility] at [phone] with questions or concerns regarding your invoice.   Our billing staff will not be able to assist you with questions regarding bills from these companies.  You will be contacted with the lab results as soon as they are available. The fastest way to get your results is to activate your My Chart account. Instructions are located on the last page of this paperwork. If you have not heard from [place] regarding the results in 2  weeks, please contact this office.        Health Maintenance, Male A healthy lifestyle and preventive care is important for your health and wellness. Ask your health care provider about what schedule of regular examinations is right for you. What should I know about weight and diet?  Eat a Healthy Diet  Eat plenty of vegetables, fruits, whole grains, low-fat dairy products, and lean protein.  Do not eat a lot of foods high in solid fats, added sugars, or salt. Maintain a Healthy Weight  Regular exercise can help you achieve or maintain a healthy weight. You should:  Do at least 150 minutes of exercise each week. The exercise should increase your heart rate and make you sweat (moderate-intensity exercise).  Do strength-training exercises  at least twice a week. Watch Your Levels of Cholesterol and Blood Lipids  Have your blood tested for lipids and cholesterol every 5 years starting at 59 years of age. If you are at high risk for heart disease, you should start having your blood tested when you are 59 years old. You may need to have your cholesterol levels checked more often if:  Your lipid or cholesterol levels are high.  You are older than 59 years of age.  You are at high risk for heart disease. What should I know about cancer screening? Many types of cancers can be detected early and may often be prevented. Lung Cancer  You should be screened every year for lung cancer if:  You are a current smoker who has smoked for at least 30 years.  You are a former smoker who has quit within the past 15 years.  Talk to your health care provider about your screening options, when you should start screening, and how often you should be screened. Colorectal Cancer  Routine colorectal cancer screening usually begins at 59 years of age and should be repeated every 5-10 years until you are 59 years old. You may need to be screened more often if early forms of precancerous polyps or small  growths are found. Your health care provider may recommend screening at an earlier age if you have risk factors for colon cancer.  Your health care provider may recommend using home test kits to check for hidden blood in the stool.  A small camera at the end of a tube can be used to examine your colon (sigmoidoscopy or colonoscopy). This checks for the earliest forms of colorectal cancer. Prostate and Testicular Cancer  Depending on your age and overall health, your health care provider may do certain tests to screen for prostate and testicular cancer.  Talk to your health care provider about any symptoms or concerns you have about testicular or prostate cancer. Skin Cancer  Check your skin from head to toe regularly.  Tell your health care provider about any new moles or changes in moles, especially if:  There is a change in a mole's size, shape, or color.  You have a mole that is larger than a pencil eraser.  Always use sunscreen. Apply sunscreen liberally and repeat throughout the day.  Protect yourself by wearing long sleeves, pants, a wide-brimmed hat, and sunglasses when outside. What should I know about heart disease, diabetes, and high blood pressure?  If you are 53-82 years of age, have your blood pressure checked every 3-5 years. If you are 34 years of age or older, have your blood pressure checked every year. You should have your blood pressure measured twice-once when you are at a hospital or clinic, and once when you are not at a hospital or clinic. Record the average of the two measurements. To check your blood pressure when you are not at a hospital or clinic, you can use:  An automated blood pressure machine at a pharmacy.  A home blood pressure monitor.  Talk to your health care provider about your target blood pressure.  If you are between 28-70 years old, ask your health care provider if you should take aspirin to prevent heart disease.  Have regular diabetes  screenings by checking your fasting blood sugar level.  If you are at a normal weight and have a low risk for diabetes, have this test once every three years after the age of 62.  If you  are overweight and have a high risk for diabetes, consider being tested at a younger age or more often.  A one-time screening for abdominal aortic aneurysm (AAA) by ultrasound is recommended for men aged 39-75 years who are current or former smokers. What should I know about preventing infection? Hepatitis B  If you have a higher risk for hepatitis B, you should be screened for this virus. Talk with your health care provider to find out if you are at risk for hepatitis B infection. Hepatitis C  Blood testing is recommended for:  Everyone born from [birth year] through 1963.  Anyone with known risk factors for hepatitis C. Sexually Transmitted Diseases (STDs)  You should be screened each year for STDs including gonorrhea and chlamydia if:  You are sexually active and are younger than 59 years of age.  You are older than 59 years of age and your health care provider tells you that you are at risk for this type of infection.  Your sexual activity has changed since you were last screened and you are at an increased risk for chlamydia or gonorrhea. Ask your health care provider if you are at risk.  Talk with your health care provider about whether you are at high risk of being infected with HIV. Your health care provider may recommend a prescription medicine to help prevent HIV infection. What else can I do?  Schedule regular health, dental, and eye exams.  Stay current with your vaccines (immunizations).  Do not use any tobacco products, such as cigarettes, chewing tobacco, and e-cigarettes. If you need help quitting, ask your health care provider.  Limit alcohol intake to no more than 2 drinks per day. One drink equals 12 ounces of beer, 5 ounces of wine, or 1 ounces of hard liquor.  Do not use street  drugs.  Do not share needles.  Ask your health care provider for help if you need support or information about quitting drugs.  Tell your health care provider if you often feel depressed.  Tell your health care provider if you have ever been abused or do not feel safe at home. This information is not intended to replace advice given to you by your health care provider. Make sure you discuss any questions you have with your health care provider. Document Released: 09/23/2007 Document Revised: 11/24/2015 Document Reviewed: 12/29/2014 Elsevier Interactive Patient Education  2017 [place] [facility]) Exercise Recommendation  Being physically active is important to prevent heart disease and stroke, the nation's No. 1and No. 5killers. To improve overall cardiovascular health, we suggest at least 150 minutes per week of moderate exercise or 75 minutes per week of vigorous exercise (or a combination of moderate and vigorous activity). Thirty minutes a day, five times a week is an easy goal to remember. You will also experience benefits even if you divide your time into two or three segments of 10 to 15 minutes per day.  For people who would benefit from lowering their blood pressure or cholesterol, we recommend 40 minutes of aerobic exercise of moderate to vigorous intensity three to four times a week to lower the risk for heart attack and stroke.  Physical activity is anything that makes you move your body and burn calories.  This includes things like climbing stairs or playing sports. Aerobic exercises benefit your heart, and include walking, jogging, swimming or biking. Strength and stretching exercises are best for overall stamina and flexibility.  The simplest, positive change you can make  to effectively improve your heart health is to start walking. It's enjoyable, free, easy, social and great exercise. A walking program is flexible and boasts high success rates  because people can stick with it. It's easy for walking to become a regular and satisfying part of life.   For Overall Cardiovascular Health:  At least 30 minutes of moderate-intensity aerobic activity at least 5 days per week for a total of 150  OR   At least 25 minutes of vigorous aerobic activity at least 3 days per week for a total of 75 minutes; or a combination of moderate- and vigorous-intensity aerobic activity  AND   Moderate- to high-intensity muscle-strengthening activity at least 2 days per week for additional health benefits.  For Lowering Blood Pressure and Cholesterol  An average 40 minutes of moderate- to vigorous-intensity aerobic activity 3 or 4 times per week  What if I can't make it to the time goal? Something is always better than nothing! And everyone has to start somewhere. Even if you've been sedentary for years, today is the day you can begin to make healthy changes in your life. If you don't think you'll make it for 30 or 40 minutes, set a reachable goal for today. You can work up toward your overall goal by increasing your time as you get stronger. Don't let all-or-nothing thinking rob you of doing what you can every day.  Source:[email or web address], MD [facility]

## 2016-08-09 NOTE — Patient Instructions (Addendum)
   IF you received an x-ray today, you will receive an invoice from [facility]. Please contact [facility] at [phone] with questions or concerns regarding your invoice.   IF you received labwork today, you will receive an invoice from [facility]. Please contact [facility] at [phone] with questions or concerns regarding your invoice.   Our billing staff will not be able to assist you with questions regarding bills from these companies.  You will be contacted with the lab results as soon as they are available. The fastest way to get your results is to activate your My Chart account. Instructions are located on the last page of this paperwork. If you have not heard from us regarding the results in 2 weeks, please contact this office.        Health Maintenance, Male A healthy lifestyle and preventive care is important for your health and wellness. Ask your health care provider about what schedule of regular examinations is right for you. What should I know about weight and diet?  Eat a Healthy Diet  Eat plenty of vegetables, fruits, whole grains, low-fat dairy products, and lean protein.  Do not eat a lot of foods high in solid fats, added sugars, or salt. Maintain a Healthy Weight  Regular exercise can help you achieve or maintain a healthy weight. You should:  Do at least 150 minutes of exercise each week. The exercise should increase your heart rate and make you sweat (moderate-intensity exercise).  Do strength-training exercises at least twice a week. Watch Your Levels of Cholesterol and Blood Lipids  Have your blood tested for lipids and cholesterol every 5 years starting at 59 years of age. If you are at high risk for heart disease, you should start having your blood tested when you are 59 years old. You may need to have your cholesterol levels checked more often if:  Your lipid or cholesterol levels are high.  You are older than 59 years of  age.  You are at high risk for heart disease. What should I know about cancer screening? Many types of cancers can be detected early and may often be prevented. Lung Cancer  You should be screened every year for lung cancer if:  You are a current smoker who has smoked for at least 30 years.  You are a former smoker who has quit within the past 15 years.  Talk to your health care provider about your screening options, when you should start screening, and how often you should be screened. Colorectal Cancer  Routine colorectal cancer screening usually begins at 59 years of age and should be repeated every 5-10 years until you are 59 years old. You may need to be screened more often if early forms of precancerous polyps or small growths are found. Your health care provider may recommend screening at an earlier age if you have risk factors for colon cancer.  Your health care provider may recommend using home test kits to check for hidden blood in the stool.  A small camera at the end of a tube can be used to examine your colon (sigmoidoscopy or colonoscopy). This checks for the earliest forms of colorectal cancer. Prostate and Testicular Cancer  Depending on your age and overall health, your health care provider may do certain tests to screen for prostate and testicular cancer.  Talk to your health care provider about any symptoms or concerns you have about testicular or prostate cancer. Skin Cancer  Check your skin from head   to toe regularly.  Tell your health care provider about any new moles or changes in moles, especially if:  There is a change in a mole's size, shape, or color.  You have a mole that is larger than a pencil eraser.  Always use sunscreen. Apply sunscreen liberally and repeat throughout the day.  Protect yourself by wearing long sleeves, pants, a wide-brimmed hat, and sunglasses when outside. What should I know about heart disease, diabetes, and high blood  pressure?  If you are 18-39 years of age, have your blood pressure checked every 3-5 years. If you are 40 years of age or older, have your blood pressure checked every year. You should have your blood pressure measured twice-once when you are at a hospital or clinic, and once when you are not at a hospital or clinic. Record the average of the two measurements. To check your blood pressure when you are not at a hospital or clinic, you can use:  An automated blood pressure machine at a pharmacy.  A home blood pressure monitor.  Talk to your health care provider about your target blood pressure.  If you are between 45-79 years old, ask your health care provider if you should take aspirin to prevent heart disease.  Have regular diabetes screenings by checking your fasting blood sugar level.  If you are at a normal weight and have a low risk for diabetes, have this test once every three years after the age of 45.  If you are overweight and have a high risk for diabetes, consider being tested at a younger age or more often.  A one-time screening for abdominal aortic aneurysm (AAA) by ultrasound is recommended for men aged 65-75 years who are current or former smokers. What should I know about preventing infection? Hepatitis B  If you have a higher risk for hepatitis B, you should be screened for this virus. Talk with your health care provider to find out if you are at risk for hepatitis B infection. Hepatitis C  Blood testing is recommended for:  Everyone born from 1945 through 1965.  Anyone with known risk factors for hepatitis C. Sexually Transmitted Diseases (STDs)  You should be screened each year for STDs including gonorrhea and chlamydia if:  You are sexually active and are younger than 59 years of age.  You are older than 59 years of age and your health care provider tells you that you are at risk for this type of infection.  Your sexual activity has changed since you were last  screened and you are at an increased risk for chlamydia or gonorrhea. Ask your health care provider if you are at risk.  Talk with your health care provider about whether you are at high risk of being infected with HIV. Your health care provider may recommend a prescription medicine to help prevent HIV infection. What else can I do?  Schedule regular health, dental, and eye exams.  Stay current with your vaccines (immunizations).  Do not use any tobacco products, such as cigarettes, chewing tobacco, and e-cigarettes. If you need help quitting, ask your health care provider.  Limit alcohol intake to no more than 2 drinks per day. One drink equals 12 ounces of beer, 5 ounces of wine, or 1 ounces of hard liquor.  Do not use street drugs.  Do not share needles.  Ask your health care provider for help if you need support or information about quitting drugs.  Tell your health care provider if you   often feel depressed.  Tell your health care provider if you have ever been abused or do not feel safe at home. This information is not intended to replace advice given to you by your health care provider. Make sure you discuss any questions you have with your health care provider. Document Released: 09/23/2007 Document Revised: 11/24/2015 Document Reviewed: 12/29/2014 Elsevier Interactive Patient Education  2017 [facility]) Exercise Recommendation  Being physically active is important to prevent heart disease and stroke, the nation's No. 1and No. 5killers. To improve overall cardiovascular health, we suggest at least 150 minutes per week of moderate exercise or 75 minutes per week of vigorous exercise (or a combination of moderate and vigorous activity). Thirty minutes a day, five times a week is an easy goal to remember. You will also experience benefits even if you divide your time into two or three segments of 10 to 15 minutes per day.  For people who would  benefit from lowering their blood pressure or cholesterol, we recommend 40 minutes of aerobic exercise of moderate to vigorous intensity three to four times a week to lower the risk for heart attack and stroke.  Physical activity is anything that makes you move your body and burn calories.  This includes things like climbing stairs or playing sports. Aerobic exercises benefit your heart, and include walking, jogging, swimming or biking. Strength and stretching exercises are best for overall stamina and flexibility.  The simplest, positive change you can make to effectively improve your heart health is to start walking. It's enjoyable, free, easy, social and great exercise. A walking program is flexible and boasts high success rates because people can stick with it. It's easy for walking to become a regular and satisfying part of life.   For Overall Cardiovascular Health:  At least 30 minutes of moderate-intensity aerobic activity at least 5 days per week for a total of 150  OR   At least 25 minutes of vigorous aerobic activity at least 3 days per week for a total of 75 minutes; or a combination of moderate- and vigorous-intensity aerobic activity  AND   Moderate- to high-intensity muscle-strengthening activity at least 2 days per week for additional health benefits.  For Lowering Blood Pressure and Cholesterol  An average 40 minutes of moderate- to vigorous-intensity aerobic activity 3 or 4 times per week  What if I can't make it to the time goal? Something is always better than nothing! And everyone has to start somewhere. Even if you've been sedentary for years, today is the day you can begin to make healthy changes in your life. If you don't think you'll make it for 30 or 40 minutes, set a reachable goal for today. You can work up toward your overall goal by increasing your time as you get stronger. Don't let all-or-nothing thinking rob you of doing what you can every day.   Source:http://www.heart.org

## 2016-08-15 LAB — CBC WITH DIFFERENTIAL/PLATELET
BASOS ABS: 0 10*3/uL (ref 0.0–0.2)
Basos: 0 %
EOS (ABSOLUTE): 0.2 10*3/uL (ref 0.0–0.4)
Eos: 3 %
HEMOGLOBIN: 15.7 g/dL (ref 13.0–17.7)
Hematocrit: 45.3 % (ref 37.5–51.0)
Immature Grans (Abs): 0 10*3/uL (ref 0.0–0.1)
Immature Granulocytes: 0 %
LYMPHS ABS: 2.8 10*3/uL (ref 0.7–3.1)
Lymphs: 50 %
MCH: 32.1 pg (ref 26.6–33.0)
MCHC: 34.7 g/dL (ref 31.5–35.7)
MCV: 93 fL (ref 79–97)
MONOCYTES: 9 %
MONOS ABS: 0.5 10*3/uL (ref 0.1–0.9)
NEUTROS ABS: 2.2 10*3/uL (ref 1.4–7.0)
Neutrophils: 38 %
Platelets: 262 10*3/uL (ref 150–379)
RBC: 4.89 x10E6/uL (ref 4.14–5.80)
RDW: 14 % (ref 12.3–15.4)
WBC: 5.8 10*3/uL (ref 3.4–10.8)

## 2016-08-15 LAB — COMPREHENSIVE METABOLIC PANEL
ALBUMIN: 4.5 g/dL (ref 3.5–5.5)
ALK PHOS: 59 IU/L (ref 39–117)
ALT: 27 IU/L (ref 0–44)
AST: 30 IU/L (ref 0–40)
Albumin/Globulin Ratio: 1.3 (ref 1.2–2.2)
BUN / CREAT RATIO: 13 (ref 9–20)
BUN: 14 mg/dL (ref 6–24)
Bilirubin Total: 0.5 mg/dL (ref 0.0–1.2)
CHLORIDE: 100 mmol/L (ref 96–106)
CO2: 24 mmol/L (ref 18–29)
Calcium: 9.6 mg/dL (ref 8.7–10.2)
Creatinine, Ser: 1.09 mg/dL (ref 0.76–1.27)
GFR calc Af Amer: 85 mL/min/{1.73_m2} (ref >59)
GFR calc non Af Amer: 74 mL/min/{1.73_m2} (ref >59)
GLOBULIN, TOTAL: 3.5 g/dL (ref 1.5–4.5)
GLUCOSE: 91 mg/dL (ref 65–99)
Potassium: 4.1 mmol/L (ref 3.5–5.2)
SODIUM: 142 mmol/L (ref 134–144)
Total Protein: 8 g/dL (ref 6.0–8.5)

## 2016-08-15 LAB — HEMOGLOBIN A1C
Est. average glucose Bld gHb Est-mCnc: 111 mg/dL
HEMOGLOBIN A1C: 5.5 % (ref 4.8–5.6)

## 2016-08-15 LAB — PSA: PROSTATE SPECIFIC AG, SERUM: 1.6 ng/mL (ref 0.0–4.0)

## 2016-08-15 LAB — LIPID PANEL
CHOL/HDL RATIO: 5.8 ratio — AB (ref 0.0–5.0)
Cholesterol, Total: 198 mg/dL (ref 100–199)
HDL: 34 mg/dL — ABNORMAL LOW (ref >39)
LDL Calculated: 138 mg/dL — ABNORMAL HIGH (ref 0–99)
Triglycerides: 131 mg/dL (ref 0–149)
VLDL Cholesterol Cal: 26 mg/dL (ref 5–40)

## 2016-08-15 LAB — ANA,IFA RA DIAG PNL W/RFLX TIT/PATN
ANA Titer 1: NEGATIVE
CYCLIC CITRULLIN PEPTIDE AB: 6 U (ref 0–19)

## 2016-08-15 LAB — HIV ANTIBODY (ROUTINE TESTING W REFLEX): HIV SCREEN 4TH GENERATION: NONREACTIVE

## 2016-08-15 LAB — HEPATITIS C ANTIBODY

## 2016-08-15 LAB — TSH: TSH: 2.73 u[IU]/mL (ref 0.450–4.500)

## 2016-08-16 DIAGNOSIS — L910 Hypertrophic scar: Secondary | ICD-10-CM | POA: Diagnosis not present

## 2016-08-16 DIAGNOSIS — L821 Other seborrheic keratosis: Secondary | ICD-10-CM | POA: Diagnosis not present

## 2016-08-16 DIAGNOSIS — D239 Other benign neoplasm of skin, unspecified: Secondary | ICD-10-CM | POA: Diagnosis not present

## 2016-11-29 DIAGNOSIS — H40053 Ocular hypertension, bilateral: Secondary | ICD-10-CM | POA: Diagnosis not present

## 2016-11-29 DIAGNOSIS — H2513 Age-related nuclear cataract, bilateral: Secondary | ICD-10-CM | POA: Diagnosis not present

## 2016-11-29 DIAGNOSIS — H40023 Open angle with borderline findings, high risk, bilateral: Secondary | ICD-10-CM | POA: Diagnosis not present

## 2016-11-29 DIAGNOSIS — Z83511 Family history of glaucoma: Secondary | ICD-10-CM | POA: Diagnosis not present

## 2017-01-17 DIAGNOSIS — N400 Enlarged prostate without lower urinary tract symptoms: Secondary | ICD-10-CM | POA: Diagnosis not present

## 2017-01-24 DIAGNOSIS — N401 Enlarged prostate with lower urinary tract symptoms: Secondary | ICD-10-CM | POA: Diagnosis not present

## 2017-01-24 DIAGNOSIS — N5201 Erectile dysfunction due to arterial insufficiency: Secondary | ICD-10-CM | POA: Diagnosis not present

## 2017-01-24 DIAGNOSIS — R3915 Urgency of urination: Secondary | ICD-10-CM | POA: Diagnosis not present

## 2017-05-14 ENCOUNTER — Ambulatory Visit (INDEPENDENT_AMBULATORY_CARE_PROVIDER_SITE_OTHER): Payer: No Typology Code available for payment source | Admitting: Emergency Medicine

## 2017-05-14 ENCOUNTER — Encounter: Payer: Self-pay | Admitting: Emergency Medicine

## 2017-05-14 ENCOUNTER — Other Ambulatory Visit: Payer: Self-pay

## 2017-05-14 VITALS — BP 124/84 | HR 78 | Temp 98.6°F | Resp 16 | Ht 67.25 in | Wt 198.4 lb

## 2017-05-14 DIAGNOSIS — Z0000 Encounter for general adult medical examination without abnormal findings: Secondary | ICD-10-CM | POA: Diagnosis not present

## 2017-05-14 DIAGNOSIS — M5431 Sciatica, right side: Secondary | ICD-10-CM | POA: Diagnosis not present

## 2017-05-14 DIAGNOSIS — Z23 Encounter for immunization: Secondary | ICD-10-CM

## 2017-05-14 DIAGNOSIS — I10 Essential (primary) hypertension: Secondary | ICD-10-CM

## 2017-05-14 MED ORDER — AMLODIPINE BESYLATE 5 MG PO TABS: 5.0000 mg | ORAL_TABLET | Freq: Every day | ORAL | 3 refills | Status: DC

## 2017-05-14 NOTE — Patient Instructions (Addendum)
   IF you received an x-ray today, you will receive an invoice from [facility]. Please contact  Radiology at [phone] with questions or concerns regarding your invoice.   IF you received labwork today, you will receive an invoice from [facility]. Please contact [facility] at [phone] with questions or concerns regarding your invoice.   Our billing staff will not be able to assist you with questions regarding bills from these companies.  You will be contacted with the lab results as soon as they are available. The fastest way to get your results is to activate your My Chart account. Instructions are located on the last page of this paperwork. If you have not heard from us regarding the results in 2 weeks, please contact this office.      Health Maintenance, Male A healthy lifestyle and preventive care is important for your health and wellness. Ask your health care provider about what schedule of regular examinations is right for you. What should I know about weight and diet? Eat a Healthy Diet  Eat plenty of vegetables, fruits, whole grains, low-fat dairy products, and lean protein.  Do not eat a lot of foods high in solid fats, added sugars, or salt.  Maintain a Healthy Weight Regular exercise can help you achieve or maintain a healthy weight. You should:  Do at least 150 minutes of exercise each week. The exercise should increase your heart rate and make you sweat (moderate-intensity exercise).  Do strength-training exercises at least twice a week.  Watch Your Levels of Cholesterol and Blood Lipids  Have your blood tested for lipids and cholesterol every 5 years starting at 60 years of age. If you are at high risk for heart disease, you should start having your blood tested when you are 60 years old. You may need to have your cholesterol levels checked more often if: ? Your lipid or cholesterol levels are high. ? You are older than 60 years of age. ? You  are at high risk for heart disease.  What should I know about cancer screening? Many types of cancers can be detected early and may often be prevented. Lung Cancer  You should be screened every year for lung cancer if: ? You are a current smoker who has smoked for at least 30 years. ? You are a former smoker who has quit within the past 15 years.  Talk to your health care provider about your screening options, when you should start screening, and how often you should be screened.  Colorectal Cancer  Routine colorectal cancer screening usually begins at 60 years of age and should be repeated every 5-10 years until you are 60 years old. You may need to be screened more often if early forms of precancerous polyps or small growths are found. Your health care provider may recommend screening at an earlier age if you have risk factors for colon cancer.  Your health care provider may recommend using home test kits to check for hidden blood in the stool.  A small camera at the end of a tube can be used to examine your colon (sigmoidoscopy or colonoscopy). This checks for the earliest forms of colorectal cancer.  Prostate and Testicular Cancer  Depending on your age and overall health, your health care provider may do certain tests to screen for prostate and testicular cancer.  Talk to your health care provider about any symptoms or concerns you have about testicular or prostate cancer.  Skin Cancer  Check your skin   from head to toe regularly.  Tell your health care provider about any new moles or changes in moles, especially if: ? There is a change in a mole's size, shape, or color. ? You have a mole that is larger than a pencil eraser.  Always use sunscreen. Apply sunscreen liberally and repeat throughout the day.  Protect yourself by wearing long sleeves, pants, a wide-brimmed hat, and sunglasses when outside.  What should I know about heart disease, diabetes, and high blood  pressure?  If you are 18-39 years of age, have your blood pressure checked every 3-5 years. If you are 40 years of age or older, have your blood pressure checked every year. You should have your blood pressure measured twice-once when you are at a hospital or clinic, and once when you are not at a hospital or clinic. Record the average of the two measurements. To check your blood pressure when you are not at a hospital or clinic, you can use: ? An automated blood pressure machine at a pharmacy. ? A home blood pressure monitor.  Talk to your health care provider about your target blood pressure.  If you are between 45-79 years old, ask your health care provider if you should take aspirin to prevent heart disease.  Have regular diabetes screenings by checking your fasting blood sugar level. ? If you are at a normal weight and have a low risk for diabetes, have this test once every three years after the age of 45. ? If you are overweight and have a high risk for diabetes, consider being tested at a younger age or more often.  A one-time screening for abdominal aortic aneurysm (AAA) by ultrasound is recommended for men aged 65-75 years who are current or former smokers. What should I know about preventing infection? Hepatitis B If you have a higher risk for hepatitis B, you should be screened for this virus. Talk with your health care provider to find out if you are at risk for hepatitis B infection. Hepatitis C Blood testing is recommended for:  Everyone born from 1945 through 1965.  Anyone with known risk factors for hepatitis C.  Sexually Transmitted Diseases (STDs)  You should be screened each year for STDs including gonorrhea and chlamydia if: ? You are sexually active and are younger than 60 years of age. ? You are older than 60 years of age and your health care provider tells you that you are at risk for this type of infection. ? Your sexual activity has changed since you were last  screened and you are at an increased risk for chlamydia or gonorrhea. Ask your health care provider if you are at risk.  Talk with your health care provider about whether you are at high risk of being infected with HIV. Your health care provider may recommend a prescription medicine to help prevent HIV infection.  What else can I do?  Schedule regular health, dental, and eye exams.  Stay current with your vaccines (immunizations).  Do not use any tobacco products, such as cigarettes, chewing tobacco, and e-cigarettes. If you need help quitting, ask your health care provider.  Limit alcohol intake to no more than 2 drinks per day. One drink equals 12 ounces of beer, 5 ounces of wine, or 1 ounces of hard liquor.  Do not use street drugs.  Do not share needles.  Ask your health care provider for help if you need support or information about quitting drugs.  Tell your health care   provider if you often feel depressed.  Tell your health care provider if you have ever been abused or do not feel safe at home. This information is not intended to replace advice given to you by your health care provider. Make sure you discuss any questions you have with your health care provider. Document Released: 09/23/2007 Document Revised: 11/24/2015 Document Reviewed: 12/29/2014 Elsevier Interactive Patient Education  2018 [place] [facility]) Exercise Recommendation  Being physically active is important to prevent heart disease and stroke, the nation's No. 1and No. 5killers. To improve overall cardiovascular health, we suggest at least 150 minutes per week of moderate exercise or 75 minutes per week of vigorous exercise (or a combination of moderate and vigorous activity). Thirty minutes a day, five times a week is an easy goal to remember. You will also experience benefits even if you divide your time into two or three segments of 10 to 15 minutes per day.  For people who would  benefit from lowering their blood pressure or cholesterol, we recommend 40 minutes of aerobic exercise of moderate to vigorous intensity three to four times a week to lower the risk for heart attack and stroke.  Physical activity is anything that makes you move your body and burn calories.  This includes things like climbing stairs or playing sports. Aerobic exercises benefit your heart, and include walking, jogging, swimming or biking. Strength and stretching exercises are best for overall stamina and flexibility.  The simplest, positive change you can make to effectively improve your heart health is to start walking. It's enjoyable, free, easy, social and great exercise. A walking program is flexible and boasts high success rates because people can stick with it. It's easy for walking to become a regular and satisfying part of life.   For Overall Cardiovascular Health:  At least 30 minutes of moderate-intensity aerobic activity at least 5 days per week for a total of 150  OR   At least 25 minutes of vigorous aerobic activity at least 3 days per week for a total of 75 minutes; or a combination of moderate- and vigorous-intensity aerobic activity  AND   Moderate- to high-intensity muscle-strengthening activity at least 2 days per week for additional health benefits.  For Lowering Blood Pressure and Cholesterol  An average 40 minutes of moderate- to vigorous-intensity aerobic activity 3 or 4 times per week  What if I can't make it to the time goal? Something is always better than nothing! And everyone has to start somewhere. Even if you've been sedentary for years, today is the day you can begin to make healthy changes in your life. If you don't think you'll make it for 30 or 40 minutes, set a reachable goal for today. You can work up toward your overall goal by increasing your time as you get stronger. Don't let all-or-nothing thinking rob you of doing what you can every day.   Source:http://www.heart.org

## 2017-05-14 NOTE — Progress Notes (Signed)
Aaron Bowman 62 y.o.   Chief Complaint  Patient presents with  . Annual Exam    HISTORY OF PRESENT ILLNESS: This is a 62 y.o. male Here for annual exam; no complaints and no medical concerns.  Has history of hypertension.  Systolics at home between 150 and 160.  Diastolics between 161 096.  Compliant with medication.   HPI   Prior to Admission medications   Medication Sig Start Date End Date Taking? Authorizing Provider  aspirin EC 81 MG tablet Take 81 mg by mouth daily.   Yes [provider]  cholecalciferol (VITAMIN D) 400 units TABS tablet Take 400 Units by mouth daily.   Yes [provider]  Omega-3 Fatty Acids (FISH OIL) 1000 MG CAPS Take 1,000 mg by mouth.   Yes [provider]  vitamin B-12 (CYANOCOBALAMIN) 100 MCG tablet Take 100 mcg by mouth daily.   Yes [provider]  vitamin E 1000 UNIT capsule Take 1,000 Units by mouth daily.   Yes [provider]  cyclobenzaprine (FLEXERIL) 5 MG tablet Take 1 tablet (5 mg total) by mouth 3 (three) times daily as needed. Patient not taking: Reported on 05/14/2017 01/06/16   Jaynee Eagles, PA-C  ferrous sulfate 325 (65 FE) MG EC tablet Take 325 mg by mouth 1 day or 1 dose.    [provider]  lisinopril-hydrochlorothiazide (PRINZIDE,ZESTORETIC) 20-25 MG tablet Take 1 tablet by mouth daily. 08/09/16 11/07/16  Horald Pollen, MD  meloxicam (MOBIC) 7.5 MG tablet Take 1 tablet (7.5 mg total) by mouth daily. Patient not taking: Reported on 05/14/2017 08/09/16   Horald Pollen, MD    No Known Allergies  Patient Active Problem List   Diagnosis Date Noted  . Essential hypertension 06/18/2007    Past Medical History:  Diagnosis Date  . GERD (gastroesophageal reflux disease)   . Tuberculosis     No past surgical history on file.  Social History   Socioeconomic History  . Marital status: Married    Spouse name: Not on file  . Number of children: Not on file  . Years of  education: Not on file  . Highest education level: Not on file  Social Needs  . Financial resource strain: Not on file  . Food insecurity - worry: Not on file  . Food insecurity - inability: Not on file  . Transportation needs - medical: Not on file  . Transportation needs - non-medical: Not on file  Occupational History  . Not on file  Tobacco Use  . Smoking status: Never Smoker  . Smokeless tobacco: Never Used  Substance and Sexual Activity  . Alcohol use: Yes    Alcohol/week: 3.0 oz    Types: 5 Cans of beer per week  . Drug use: No  . Sexual activity: Not on file  Other Topics Concern  . Not on file  Social History Narrative  . Not on file    No family history on file.   Review of Systems  Constitutional: Negative.  Negative for chills, fever and weight loss.  HENT: Negative.   Eyes: Negative.   Respiratory: Negative.  Negative for cough, hemoptysis and shortness of breath.   Cardiovascular: Negative.  Negative for chest pain, palpitations, claudication and leg swelling.  Gastrointestinal: Negative.  Negative for nausea and vomiting.  Genitourinary: Negative.  Negative for dysuria and hematuria.  Musculoskeletal: Positive for back pain (right sided sciatica) and joint pain.  Skin: Negative.   Neurological: Negative.  Negative for dizziness,  sensory change, focal weakness and headaches.  Endo/Heme/Allergies: Negative.   All other systems reviewed and are negative.  Vitals:   05/14/17 1543  BP: 124/84  Pulse: 78  Resp: 16  Temp: 98.6 F (37 C)  SpO2: 98%     Physical Exam  Constitutional: He is oriented to person, place, and time. He appears well-developed and well-nourished.  HENT:  Head: Normocephalic and atraumatic.  Right Ear: External ear normal.  Left Ear: External ear normal.  Nose: Nose normal.  Mouth/Throat: Oropharynx is clear and moist.  Eyes: Conjunctivae and EOM are normal. Pupils are equal, round, and reactive to light.  Neck: Normal range  of motion. Neck supple. No JVD present. No thyromegaly present.  Cardiovascular: Normal rate, regular rhythm and normal heart sounds.  Pulmonary/Chest: Effort normal and breath sounds normal.  Abdominal: Soft. Bowel sounds are normal. He exhibits no distension. There is no tenderness.  Musculoskeletal: Normal range of motion. He exhibits no edema or tenderness.  Lymphadenopathy:    He has no cervical adenopathy.  Neurological: He is alert and oriented to person, place, and time. No sensory deficit. He exhibits normal muscle tone.  Skin: Skin is warm and dry. Capillary refill takes less than 2 seconds. No rash noted.  Psychiatric: He has a normal mood and affect. His behavior is normal.  Vitals reviewed.    ASSESSMENT & PLAN: Aaron Bowman was seen today for annual exam.  Diagnoses and all orders for this visit:  Routine general medical examination at a health care facility -     CBC with Differential -     Hemoglobin A1c -     Lipid panel -     PSA(Must document that pt has been informed of limitations of PSA testing.)  Sciatica of right side -     Ambulatory referral to Orthopedic Surgery  Need for prophylactic vaccination and inoculation against influenza -     Flu Vaccine QUAD 36+ mos IM  Essential hypertension -     amLODipine (NORVASC) 5 MG tablet; Take 1 tablet (5 mg total) by mouth daily.    Patient Instructions       IF you received an x-ray today, you will receive an invoice from [facility]. Please contact [facility] at [phone] with questions or concerns regarding your invoice.   IF you received labwork today, you will receive an invoice from [facility]. Please contact [facility] at [phone] with questions or concerns regarding your invoice.   Our billing staff will not be able to assist you with questions regarding bills from these companies.  You will be contacted with the lab results as soon as they are available. The fastest way to get  your results is to activate your My Chart account. Instructions are located on the last page of this paperwork. If you have not heard from [place] regarding the results in 2 weeks, please contact this office.      Health Maintenance, Male A healthy lifestyle and preventive care is important for your health and wellness. Ask your health care provider about what schedule of regular examinations is right for you. What should I know about weight and diet? Eat a Healthy Diet  Eat plenty of vegetables, fruits, whole grains, low-fat dairy products, and lean protein.  Do not eat a lot of foods high in solid fats, added sugars, or salt.  Maintain a Healthy Weight Regular exercise can help you achieve or maintain a healthy weight. You should:  Do at least 150 minutes  of exercise each week. The exercise should increase your heart rate and make you sweat (moderate-intensity exercise).  Do strength-training exercises at least twice a week.  Watch Your Levels of Cholesterol and Blood Lipids  Have your blood tested for lipids and cholesterol every 5 years starting at 60 years of age. If you are at high risk for heart disease, you should start having your blood tested when you are 60 years old. You may need to have your cholesterol levels checked more often if: ? Your lipid or cholesterol levels are high. ? You are older than 60 years of age. ? You are at high risk for heart disease.  What should I know about cancer screening? Many types of cancers can be detected early and may often be prevented. Lung Cancer  You should be screened every year for lung cancer if: ? You are a current smoker who has smoked for at least 30 years. ? You are a former smoker who has quit within the past 15 years.  Talk to your health care provider about your screening options, when you should start screening, and how often you should be screened.  Colorectal Cancer  Routine colorectal cancer screening usually begins at  60 years of age and should be repeated every 5-10 years until you are 60 years old. You may need to be screened more often if early forms of precancerous polyps or small growths are found. Your health care provider may recommend screening at an earlier age if you have risk factors for colon cancer.  Your health care provider may recommend using home test kits to check for hidden blood in the stool.  A small camera at the end of a tube can be used to examine your colon (sigmoidoscopy or colonoscopy). This checks for the earliest forms of colorectal cancer.  Prostate and Testicular Cancer  Depending on your age and overall health, your health care provider may do certain tests to screen for prostate and testicular cancer.  Talk to your health care provider about any symptoms or concerns you have about testicular or prostate cancer.  Skin Cancer  Check your skin from head to toe regularly.  Tell your health care provider about any new moles or changes in moles, especially if: ? There is a change in a mole's size, shape, or color. ? You have a mole that is larger than a pencil eraser.  Always use sunscreen. Apply sunscreen liberally and repeat throughout the day.  Protect yourself by wearing long sleeves, pants, a wide-brimmed hat, and sunglasses when outside.  What should I know about heart disease, diabetes, and high blood pressure?  If you are 28-77 years of age, have your blood pressure checked every 3-5 years. If you are 78 years of age or older, have your blood pressure checked every year. You should have your blood pressure measured twice-once when you are at a hospital or clinic, and once when you are not at a hospital or clinic. Record the average of the two measurements. To check your blood pressure when you are not at a hospital or clinic, you can use: ? An automated blood pressure machine at a pharmacy. ? A home blood pressure monitor.  Talk to your health care provider about  your target blood pressure.  If you are between 26-43 years old, ask your health care provider if you should take aspirin to prevent heart disease.  Have regular diabetes screenings by checking your fasting blood sugar level. ? If you  are at a normal weight and have a low risk for diabetes, have this test once every three years after the age of 73. ? If you are overweight and have a high risk for diabetes, consider being tested at a younger age or more often.  A one-time screening for abdominal aortic aneurysm (AAA) by ultrasound is recommended for men aged 55-75 years who are current or former smokers. What should I know about preventing infection? Hepatitis B If you have a higher risk for hepatitis B, you should be screened for this virus. Talk with your health care provider to find out if you are at risk for hepatitis B infection. Hepatitis C Blood testing is recommended for:  Everyone born from [birth year] through 1963.  Anyone with known risk factors for hepatitis C.  Sexually Transmitted Diseases (STDs)  You should be screened each year for STDs including gonorrhea and chlamydia if: ? You are sexually active and are younger than 60 years of age. ? You are older than 60 years of age and your health care provider tells you that you are at risk for this type of infection. ? Your sexual activity has changed since you were last screened and you are at an increased risk for chlamydia or gonorrhea. Ask your health care provider if you are at risk.  Talk with your health care provider about whether you are at high risk of being infected with HIV. Your health care provider may recommend a prescription medicine to help prevent HIV infection.  What else can I do?  Schedule regular health, dental, and eye exams.  Stay current with your vaccines (immunizations).  Do not use any tobacco products, such as cigarettes, chewing tobacco, and e-cigarettes. If you need help quitting, ask your health care  provider.  Limit alcohol intake to no more than 2 drinks per day. One drink equals 12 ounces of beer, 5 ounces of wine, or 1 ounces of hard liquor.  Do not use street drugs.  Do not share needles.  Ask your health care provider for help if you need support or information about quitting drugs.  Tell your health care provider if you often feel depressed.  Tell your health care provider if you have ever been abused or do not feel safe at home. This information is not intended to replace advice given to you by your health care provider. Make sure you discuss any questions you have with your health care provider. Document Released: 09/23/2007 Document Revised: 11/24/2015 Document Reviewed: 12/29/2014 Elsevier Interactive Patient Education  2018 [place] (AHA) Exercise Recommendation  Being physically active is important to prevent heart disease and stroke, the nation's No. 1and No. 5killers. To improve overall cardiovascular health, we suggest at least 150 minutes per week of moderate exercise or 75 minutes per week of vigorous exercise (or a combination of moderate and vigorous activity). Thirty minutes a day, five times a week is an easy goal to remember. You will also experience benefits even if you divide your time into two or three segments of 10 to 15 minutes per day.  For people who would benefit from lowering their blood pressure or cholesterol, we recommend 40 minutes of aerobic exercise of moderate to vigorous intensity three to four times a week to lower the risk for heart attack and stroke.  Physical activity is anything that makes you move your body and burn calories.  This includes things like climbing stairs or playing sports. Aerobic exercises benefit your  heart, and include walking, jogging, swimming or biking. Strength and stretching exercises are best for overall stamina and flexibility.  The simplest, positive change you can make to  effectively improve your heart health is to start walking. It's enjoyable, free, easy, social and great exercise. A walking program is flexible and boasts high success rates because people can stick with it. It's easy for walking to become a regular and satisfying part of life.   For Overall Cardiovascular Health:  At least 30 minutes of moderate-intensity aerobic activity at least 5 days per week for a total of 150  OR   At least 25 minutes of vigorous aerobic activity at least 3 days per week for a total of 75 minutes; or a combination of moderate- and vigorous-intensity aerobic activity  AND   Moderate- to high-intensity muscle-strengthening activity at least 2 days per week for additional health benefits.  For Lowering Blood Pressure and Cholesterol  An average 40 minutes of moderate- to vigorous-intensity aerobic activity 3 or 4 times per week  What if I can't make it to the time goal? Something is always better than nothing! And everyone has to start somewhere. Even if you've been sedentary for years, today is the day you can begin to make healthy changes in your life. If you don't think you'll make it for 30 or 40 minutes, set a reachable goal for today. You can work up toward your overall goal by increasing your time as you get stronger. Don't let all-or-nothing thinking rob you of doing what you can every day.  Source:http://www.heart.Burnadette Pop, MD [facility]

## 2017-05-15 ENCOUNTER — Encounter: Payer: Self-pay | Admitting: *Deleted

## 2017-05-15 LAB — LIPID PANEL
CHOLESTEROL TOTAL: 200 mg/dL — AB (ref 100–199)
Chol/HDL Ratio: 4.4 ratio (ref 0.0–5.0)
HDL: 45 mg/dL (ref >39)
LDL CALC: 131 mg/dL — AB (ref 0–99)
TRIGLYCERIDES: 122 mg/dL (ref 0–149)
VLDL CHOLESTEROL CAL: 24 mg/dL (ref 5–40)

## 2017-05-15 LAB — CBC WITH DIFFERENTIAL/PLATELET
BASOS: 0 %
Basophils Absolute: 0 10*3/uL (ref 0.0–0.2)
EOS (ABSOLUTE): 0.2 10*3/uL (ref 0.0–0.4)
EOS: 4 %
HEMATOCRIT: 41.8 % (ref 37.5–51.0)
Hemoglobin: 14.2 g/dL (ref 13.0–17.7)
IMMATURE GRANS (ABS): 0 10*3/uL (ref 0.0–0.1)
IMMATURE GRANULOCYTES: 0 %
LYMPHS: 56 %
Lymphocytes Absolute: 3.1 10*3/uL (ref 0.7–3.1)
MCH: 32.3 pg (ref 26.6–33.0)
MCHC: 34 g/dL (ref 31.5–35.7)
MCV: 95 fL (ref 79–97)
Monocytes Absolute: 0.7 10*3/uL (ref 0.1–0.9)
Monocytes: 12 %
NEUTROS ABS: 1.6 10*3/uL (ref 1.4–7.0)
NEUTROS PCT: 28 %
Platelets: 209 10*3/uL (ref 150–379)
RBC: 4.4 x10E6/uL (ref 4.14–5.80)
RDW: 14.2 % (ref 12.3–15.4)
WBC: 5.6 10*3/uL (ref 3.4–10.8)

## 2017-05-15 LAB — HEMOGLOBIN A1C
Est. average glucose Bld gHb Est-mCnc: 114 mg/dL
Hgb A1c MFr Bld: 5.6 % (ref 4.8–5.6)

## 2017-05-15 LAB — PSA: Prostate Specific Ag, Serum: 1.5 ng/mL (ref 0.0–4.0)

## 2017-05-15 NOTE — Progress Notes (Signed)
Letter sent.

## 2017-05-22 ENCOUNTER — Encounter: Payer: Self-pay | Admitting: Emergency Medicine

## 2017-06-04 ENCOUNTER — Encounter: Payer: Self-pay | Admitting: Physician Assistant

## 2017-06-04 ENCOUNTER — Other Ambulatory Visit: Payer: Self-pay

## 2017-06-04 ENCOUNTER — Ambulatory Visit (INDEPENDENT_AMBULATORY_CARE_PROVIDER_SITE_OTHER): Payer: No Typology Code available for payment source | Admitting: Physician Assistant

## 2017-06-04 VITALS — BP 126/86 | HR 99 | Temp 99.0°F | Resp 16 | Ht 67.0 in | Wt 196.0 lb

## 2017-06-04 DIAGNOSIS — K529 Noninfective gastroenteritis and colitis, unspecified: Secondary | ICD-10-CM | POA: Diagnosis not present

## 2017-06-04 DIAGNOSIS — R112 Nausea with vomiting, unspecified: Secondary | ICD-10-CM

## 2017-06-04 LAB — POCT URINALYSIS DIP (MANUAL ENTRY)
Bilirubin, UA: NEGATIVE
Glucose, UA: NEGATIVE mg/dL
Ketones, POC UA: NEGATIVE mg/dL
Leukocytes, UA: NEGATIVE
NITRITE UA: NEGATIVE
RBC UA: NEGATIVE
Spec Grav, UA: 1.015 (ref 1.010–1.025)
UROBILINOGEN UA: 0.2 U/dL
pH, UA: 8.5 — AB (ref 5.0–8.0)

## 2017-06-04 MED ORDER — ONDANSETRON 4 MG PO TBDP
8.0000 mg | ORAL_TABLET | Freq: Once | ORAL | Status: AC
  Administered 2017-06-04: 8 mg via ORAL

## 2017-06-04 MED ORDER — ONDANSETRON 4 MG PO TBDP: 4.0000 mg | ORAL_TABLET | Freq: Three times a day (TID) | ORAL | 0 refills | Status: DC | PRN

## 2017-06-04 NOTE — Progress Notes (Signed)
PRIMARY [facility] AT [place], [place] [address] [phone]  Date:  06/04/2017   Name:  Aaron Bowman   DOB:  1955-11-22   MRN:  [id number]  PCP:  Horald Pollen, MD    History of Present Illness:  Aaron Bowman is a 62 y.o. male patient who presents to PCP with  Chief Complaint  Patient presents with  . Emesis    x last night  . Diarrhea    x last night     --last night around 9pm, developed diarrhea and emesis.  He states he has 15 episodes of emesis.  2 episodes of diarrhea.  Non-bloody or bilious.  No abdominal pain.  No new medications.  He ate beans , rice, salad, pork.  His wife developed nausea this morning.  No fever.  No dysuria, hematuria, but does have frequency.    Patient Active Problem List   Diagnosis Date Noted  . Essential hypertension 06/18/2007    Past Medical History:  Diagnosis Date  . Arthritis   . GERD (gastroesophageal reflux disease)   . Hypertension   . Tuberculosis     No past surgical history on file.  Social History   Tobacco Use  . Smoking status: Never Smoker  . Smokeless tobacco: Never Used  Substance Use Topics  . Alcohol use: Yes    Alcohol/week: 3.0 oz    Types: 5 Cans of beer per week  . Drug use: No    Family History  Problem Relation Age of Onset  . Hypertension Mother   . Cancer Sister   . Cancer Brother   . Cancer Brother     No Known Allergies  Medication list has been reviewed and updated.  Current Outpatient Medications on File Prior to Visit  Medication Sig Dispense Refill  . amLODipine (NORVASC) 5 MG tablet Take 1 tablet (5 mg total) by mouth daily. 90 tablet 3  . aspirin EC 81 MG tablet Take 81 mg by mouth daily.    . cholecalciferol (VITAMIN D) 400 units TABS tablet Take 400 Units by mouth daily.    . cyclobenzaprine (FLEXERIL) 5 MG tablet Take 1 tablet (5 mg total) by mouth 3 (three) times daily as needed. 60 tablet 1  . ferrous sulfate 325 (65 FE) MG EC tablet Take 325 mg by mouth 1  day or 1 dose.    . meloxicam (MOBIC) 7.5 MG tablet Take 1 tablet (7.5 mg total) by mouth daily. 30 tablet 1  . Omega-3 Fatty Acids (FISH OIL) 1000 MG CAPS Take 1,000 mg by mouth.    . vitamin B-12 (CYANOCOBALAMIN) 100 MCG tablet Take 100 mcg by mouth daily.    . vitamin E 1000 UNIT capsule Take 1,000 Units by mouth daily.    Marland Kitchen lisinopril-hydrochlorothiazide (PRINZIDE,ZESTORETIC) 20-25 MG tablet Take 1 tablet by mouth daily. 90 tablet 3   No current facility-administered medications on file prior to visit.     ROS ROS otherwise unremarkable unless listed above.  Physical Examination: BP 126/86   Pulse 99   Temp 99 F (37.2 C) (Oral)   Resp 16   Ht 5\' 7"  (1.702 m)   Wt 196 lb (88.9 kg)   SpO2 99%   BMI 30.70 kg/m  Ideal Body Weight: Weight in (lb) to have BMI = 25: 159.3  Physical Exam  Constitutional: He is oriented to person, place, and time. He appears well-developed and well-nourished. No distress.  HENT:  Head: Normocephalic and atraumatic.  Eyes:  Conjunctivae and EOM are normal. Pupils are equal, round, and reactive to light.  Cardiovascular: Normal rate and regular rhythm.  No murmur heard. Pulmonary/Chest: Effort normal. No respiratory distress. He has no wheezes.  Abdominal: Soft. Normal appearance and bowel sounds are normal. There is no tenderness. There is no tenderness at McBurney's point and negative Murphy's sign.  Neurological: He is alert and oriented to person, place, and time.  Skin: Skin is warm and dry. He is not diaphoretic.  Psychiatric: He has a normal mood and affect. His behavior is normal.   Results for orders placed or performed in visit on 06/04/17  POCT urinalysis dipstick  Result Value Ref Range   Color, UA yellow yellow   Clarity, UA clear clear   Glucose, UA negative negative mg/dL   Bilirubin, UA negative negative   Ketones, POC UA negative negative mg/dL   Spec Grav, UA 1.015 1.010 - 1.025   Blood, UA negative negative   pH, UA 8.5  (A) 5.0 - 8.0   Protein Ur, POC =30 (A) negative mg/dL   Urobilinogen, UA 0.2 0.2 or 1.0 E.U./dL   Nitrite, UA Negative Negative   Leukocytes, UA Negative Negative   Assessment and Plan: Aaron Bowman is a 62 y.o. male who is here today for cc of  Chief Complaint  Patient presents with  . Emesis    x last night  . Diarrhea    x last night   Gastroenteritis - Plan: ondansetron (ZOFRAN ODT) 4 MG disintegrating tablet  Non-intractable vomiting with nausea, unspecified vomiting type - Plan: POCT urinalysis dipstick, ondansetron (ZOFRAN-ODT) disintegrating tablet 8 mg, ondansetron (ZOFRAN ODT) 4 MG disintegrating tablet  Ivar Drape, PA-C [facility] 2/25/20198:49 AM

## 2017-06-04 NOTE — Patient Instructions (Addendum)
You must push fluids.  This can be the SRO listed below, or half gatorade/half water.    Gastroenterite viral, adulta (Viral Gastroenteritis, Adult) A gastroenterite viral tambm  conhecida como gripe estomacal. Esse quadro clnico  causado por certos germes (vrus). Esses vrus podem ser facilmente transmitidos de pessoa para pessoa (so muito contagiosos). Esse quadro clnico pode causar fezes lquidas (diarreia), febre e vontade de vomitar (vmito). A diarreia e o vmito podem fazer voc se sentir fraco e causar desidratao. A desidratao pode fazer com que voc sinta cansao e sede, fique com a boca seca e tenha vontade de fazer xixi (urinar) com menor frequncia. Adultos mais idosos, pessoas com outras doenas ou com o sistema de defesa (sistema imune) enfraquecido correm maior risco de desidratao.  importante repor os lquidos perdidos com a diarreia e com o vmito. TRATAMENTO DOMICILIAR Siga as instrues do seu mdico sobre como cuidar de si em casa. Alimentos e bebidas Siga essas instrues de acordo com as orientaes do seu mdico:  Tome uma soluo de reidratao oral (SRO). Essa  uma bebida vendida em farmcias e lojas.  Beba lquidos claros em pequenas quantidades como for capaz, tais como: ? gua. ? Pedaos de gelo. ? Sucos de frutas diludos. ? Bebidas esportivas com baixo teor de calorias.  Coma alimentos leves e de fcil digesto em pequenas quantidades como for capaz, tais como: ? Bananas. ? Molho de ma. ? Arroz. ? Carnes com pouca gordura [employer]). ? Torradas. ? Bolachas.  Evite lquidos com muito acar ou cafena.  Evite bebidas alcolicas.  Evite alimentos temperados ou gordurosos. Instrues gerais  Beba lquidos em quantidade suficiente para manter seu xixi (urina) claro ou na cor amarelo plida.  Lave as mos com frequncia. Se no puder usar gua e sabo, use gel antissptico para as mos.  Certifique-se de que todas as pessoas na sua casa  lavem as mos bem e com frequncia.  Descanse em casa enquanto se recupera.  Tome medicamentos vendidos com ou sem prescrio somente de acordo com as indicaes do seu mdico.  Observe o seu quadro clnico em busca de alteraes.  Tome um banho morno para Public house manager a Merchandiser, retail ou dores Furniture conservator/restorer diarreia.  Comparea a todas as consultas de acompanhamento de acordo com as orientaes do seu mdico. Isso  importante. OBTENHA AJUDA SE:  No conseguir reter lquidos.  Seus sintomas piorarem.  Surgirem novos sintomas.  Sentir vertigem ou Runner, broadcasting/film/video.  Tiver cibras.  OBTENHA AJUDA IMEDIATAMENTE SE:  Sentir dor no peito.  Sentir muita fraqueza ou Development worker, community).  Vir sangue no seu vmito.  Seu vmito tiver a aparncia de borra de caf.  Seus excrementos (fezes) ficarem sanguinolentos ou pretos ou tiverem aparncia de piche.  Sentir dor de cabea intensa, rigidez no pescoo ou ambas as coisas.  Surgirem erupes cutneas.  Sentir dor intensa, clicas ou inchao na barriga (abdome).  Tiver dificuldade para respirar.  Sua respirao ficar muito rpida.  Seu corao estiver batendo muito rapidamente.  Sua pele ficar fria e pegajosa.  Sentir-se confuso.  Sentir dor Sales executive.  Apresentar sinais de desidratao, tais como: ? Urina escura ou pouca ou nenhuma urina. ? Lbios rachados. ? [employer]. ? Olhos fundos. ? Sonolncia. ? Lavina Hamman.  Estas informaes no se destinam a substituir as recomendaes de seu mdico. No deixe de discutir quaisquer dvidas com seu mdico. Document Released: 08/13/2008 Document Revised: 07/19/2015 Document Reviewed: 12/01/2014 Elsevier Interactive Patient Education  2017 Gross de alimentos para ajudar a  aliviar a diarreia, adultos Food Choices to Help Relieve Diarrhea, Adult Quando se tem diarreia, os alimentos que voc come e seus hbitos alimentares so muito importantes. Escolher os alimentos e bebidas certos  pode ajudar:  Aliviem a diarreia.  Reponham o lquido e os nutrientes perdidos.  Previnam desidratao.  Quais so as [employer] devo seguir? Para aliviar a diarreia  Escolha alimentos com menos de 2 g, ou 0,07 oz., de fibra por poro.  Limite as gorduras a menos de 8 colheres de ch (38 g ou 1,34 oz) por dia.  Evite: ? Alimentos e bebidas adoados com xarope de milho com alto teor de frutose, mel ou lcoois de acar como xilitol, sorbitol e manitol. ? Alimentos com muita gordura ou acar. ? Alimentos fritos, gordurosos ou temperados. ? Gros, pes e cereais ricos em fibras. ? Frutas e legumes crus.  Coma alimentos ricos em probiticos. Esses alimentos incluem produtos lcteos como iogurte e produtos com leite fermentado. Eles ajudam a aumentar o nmero de bactrias no estmago e intestinos (trato gastrointestinal ou trato GI).  Caso sofra de intolerncia  lactose, evite produtos lcteos. Eles podem piorar sua diarreia.  Tome medicamentos para ajudar a parar a diarreia (medicamentos antidiarreicos) somente de acordo com as instrues do seu mdico. Reposio dos nutrientes  Coma pequenas refeies ou lanches a cada 3-4 horas.  Coma alimentos leves, como arroz branco, torradas ou batatas cozidas, at a diarreia comear a Careers information officer. Reintroduza gradualmente alimentos ricos em nutrientes de acordo com sua tolerncia ou como indicado pelo seu mdico. Isso inclui: ? Alimentos proteicos bem cozidos. ? Frutas e verduras descascadas, sem sementes e cozidas at [employer]. ? Produtos lcteos com pouca gordura.  Tome suplementos de vitaminas e minerais como orientado pelo seu [place] prevenir a desidratao   Comece bebericando gua ou uma soluo especial para prevenir a desidratao (soluo de reidratao oral, ou SRO). Se sua urina ficar lmpida ou amarela plida, isso indicar que voc est obtendo quantidade suficiente de lquidos.  Tente beber pelo menos  8-10 xcaras de lquido todos os dias para ajudar a repor os fludos perdidos.  Voc querer beber outros lquidos alm de gua, como sucos transparentes ou bebidas esportivas descafeinadas de acordo com a sua tolerncia com as indicaes do seu mdico.  Evite tomar bebidas com cafena, como caf, ch e refrigerantes.  Evite bebidas alcolicas. Quais so os alimentos recomendados? Os itens listados podem no representar uma lista completa. Converse com um mdico sobre as melhores escolhas nutricionais para voc. Abby Potash branco. Po branco, po francs ou po srio (fresco ou torrado), [employer], po de forma ou bagels. Massas brancas. Bolachas de gua e sal e biscoitos do tipo "graham crackers". Roscas tipo pretzels. Cereais com baixo teor de fibras. Cereais cozidos com gua (como fub, fcula ou cereais cremosos). Muffins de farinha de trigo branca. Matz. Torradas brancas. Beschuit. Verduras Batatas (sem a casca). A maioria dos legumes enlatados, bem cozidos, sem cascas e sem sementes. Alface tenra. Frutas Suco de [place]. Frutas enlatadas com suco. Damascos cozidos, cerejas, toranja, pssegos, peras ou ameixas. Bananas e cantalupos frescos. Carnes e outros alimentos proteicos Annamary Rummage assado ou cozido. Ovos. Tofu. Peixe. Frutos do mar. Manteigas de frutos secos. Carne moda ou bem cozida de vaca, presunto, vitela, carneiro, porco ou aves. Produtos lcteos Iogurte simples, quefir e iogurte lquido no adoado. Leite sem lactose, leitelho, leite desnatado ou leite de soja. Queijo com baixo teor ou sem gordura. Bebidas gua. Bebidas esportivas com baixo teor de  calorias. Sucos de frutas sem polpa. Tomate peneirado e sucos vegetais. Chs descafeinados. Bebidas sem acar e no adoadas com lcoois de acar. Solues de reidratao oral, caso aprovadas pelo seu mdico. Temperos e outros alimentos Caldo de carne, caldo de legumes ou sopas feitas com os alimentos recomendados. Quais so os  alimentos no recomendados? Os itens listados podem no representar uma lista completa. Converse com um mdico sobre as melhores escolhas nutricionais para voc. Gros Pes, massas e bolachas de gros integrais, trigo integral, farelo ou centeio. Arroz selvagem ou integral. Cereais de gros integrais ou farelo. [place]. Aveia e farinha de Elna Breslow. Tortilhas ou tacos  base de milho. Cereais tipo granola. Pipoca. Verduras Verduras cruas. Verduras fritas. Repolho, brcolis, couve-de-bruxelas, alcachofra, feijes cozidos, folhas de beterraba, milho, couve-de-folhas, legumes, ervilhas, batata doce e inhame. Casca de batata. Espinafre e repolho cozidos. Lambert Mody Frutas secas, incluindo passas e tmaras. Frutas cruas. Ameixas secas ou desidratadas. Frutas enlatadas com calda de acar. Carnes e outros alimentos proteicos Carnes fritas ou gordurosas. Frios. Manteiga de nozes espessa. Nozes e sementes. Feijes e lentilhas. Berniece Salines. Cachorro-quente. Linguia. Produtos lcteos Queijos com alto teor de [place]. Leite integral, achocolatado e bebidas feitas com leite, como milk-shakes. Half-and-half. Creme, creme azedo. Sorvetes. Bebidas Bebidas cafeinadas (como caf, ch, refrigerantes ou bebidas energticas). Bebidas alcolicas. Sucos de frutas com polpa. Suco de ameixa. Refrigerantes adoados com xarope de milho com alto teor de frutose ou lcoois de acar. Bebidas esportivas com alto teor de calorias. Gorduras e leos Manteiga. Molhos cremosos. Margarina. leos para salada. Molhos para salada. Azeitonas. Abacates. Maionese. Doces e sobremesas Rosquinhas, donuts e pes doces. Sobremesas sem acar adoadas com lcoois de acar como xilitol e sorbitol. Temperos e outros alimentos Mel. Molho apimentado. Pimenta em p. Molho de carne. Sopas  base de creme ou de leite. Panquecas e waffles. Resumo  Quando se tem diarreia, os alimentos que voc come e seus hbitos alimentares so muito  importantes.  Certifique-se de beber pelo menos 8-10 xcaras de lquidos todos os dias, ou o bastante para manter sua urina lmpida ou amarela clara.  Coma alimentos leves e reintroduza gradualmente alimentos ricos em nutrientes de acordo com sua tolerncia ou como indicado pelo seu mdico.  Evite alimentos ricos em fibras, fritos, gordurosos ou temperados. Estas informaes no se destinam a substituir as recomendaes de seu mdico. No deixe de discutir quaisquer dvidas com seu mdico. Document Released: 06/23/2008 Document Revised: 07/27/2016 Document Reviewed: 07/27/2016 Elsevier Interactive Patient Education  2018 [employer].    IF you received an x-ray today, you will receive an invoice from [facility]. Please contact [facility] at [phone] with questions or concerns regarding your invoice.   IF you received labwork today, you will receive an invoice from [facility]. Please contact [facility] at [phone] with questions or concerns regarding your invoice.   Our billing staff will not be able to assist you with questions regarding bills from these companies.  You will be contacted with the lab results as soon as they are available. The fastest way to get your results is to activate your My Chart account. Instructions are located on the last page of this paperwork. If you have not heard from [place] regarding the results in 2 weeks, please contact this office.

## 2017-06-13 ENCOUNTER — Other Ambulatory Visit: Payer: Self-pay

## 2017-06-13 ENCOUNTER — Ambulatory Visit: Payer: No Typology Code available for payment source | Attending: Orthopedic Surgery

## 2017-06-13 DIAGNOSIS — M25651 Stiffness of right hip, not elsewhere classified: Secondary | ICD-10-CM | POA: Insufficient documentation

## 2017-06-13 DIAGNOSIS — R252 Cramp and spasm: Secondary | ICD-10-CM | POA: Diagnosis present

## 2017-06-13 DIAGNOSIS — G8929 Other chronic pain: Secondary | ICD-10-CM | POA: Diagnosis present

## 2017-06-13 DIAGNOSIS — R293 Abnormal posture: Secondary | ICD-10-CM | POA: Insufficient documentation

## 2017-06-13 DIAGNOSIS — M5441 Lumbago with sciatica, right side: Secondary | ICD-10-CM | POA: Insufficient documentation

## 2017-06-13 DIAGNOSIS — M25652 Stiffness of left hip, not elsewhere classified: Secondary | ICD-10-CM | POA: Diagnosis present

## 2017-06-13 NOTE — Therapy (Signed)
[facility] [address] [place], [place], [address] Phone: [phone]   Fax:  [phone]  Physical Therapy Evaluation  Patient Details  Name: Aaron Bowman MRN: [id number] Date of Birth: [birth year] Referring Provider: Rod Can, MD   Encounter Date: 06/13/2017  PT End of Session - 06/13/17 1652    Visit Number  1    Date for PT Re-Evaluation  08/08/17    Authorization Type  [facility]    PT Start Time  1604    PT Stop Time  1650    PT Time Calculation (min)  46 min    Activity Tolerance  Patient tolerated treatment well    Behavior During Therapy  [facility] for tasks assessed/performed       Past Medical History:  Diagnosis Date  . Arthritis   . GERD (gastroesophageal reflux disease)   . Hypertension   . Tuberculosis     History reviewed. No pertinent surgical history.  There were no vitals filed for this visit.   Subjective Assessment - 06/13/17 1607    Subjective  Pt presents to PT with complaints of LBP and Rt LE radiculopathy of a chronic nature.  Pt reports that leg pain was becoming worse so he went to the MD.  Pt was taking Prednisone and Rt LE is better.      Patient is accompained by:  Interpreter    Pertinent History  Pt speaks Spanish    Limitations  Standing;Walking    How long can you stand comfortably?  sometimes limited by 15 minutes     How long can you walk comfortably?  15 minutes     Diagnostic tests  x-ray: last week.  Pt not sure of result    Patient Stated Goals  reduce Rt LE, reduce LBP    Currently in Pain?  No/denies    Pain Score  -- 8/10 max within the past week    Pain Location  Back    Pain Orientation  Right;Lower    Pain Descriptors / Indicators  Aching;Constant;Pressure    Pain Type  Chronic pain    Pain Radiating Towards  Rt LE to toes    Pain Onset  More than a month ago    Pain Frequency  Intermittent    Aggravating Factors   standing activity    Pain Relieving Factors   sitting down         [facility] PT Assessment - 06/13/17 0001      Assessment   Medical Diagnosis  low back pain    Referring Provider  Rod Can, MD    Next MD Visit  4 weeks    Prior Therapy  none      Precautions   Precautions  None      Restrictions   Weight Bearing Restrictions  No      Balance Screen   Has the patient fallen in the past 6 months  No    Has the patient had a decrease in activity level because of a fear of falling?   No    Is the patient reluctant to leave their home because of a fear of falling?   No      Home Environment   Living Environment  Private residence    Type of 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    Leisure  gym       Cognition   Overall Cognitive Status  Within Functional Limits for tasks assessed      Observation/Other Assessments   Focus on Therapeutic Outcomes (FOTO)   37% limitation      Posture/Postural Control   Posture/Postural Control  Postural limitations    Postural Limitations  Forward head;Flexed trunk      ROM / Strength   AROM / PROM / Strength  AROM;PROM;Strength      AROM   Overall AROM   Deficits    Overall AROM Comments  lumbar A/ROM is limited by 25% with Rt lumbar pain reported with all lumbar A/ROM      PROM   Overall PROM   Deficits    Overall PROM Comments  hip flexibility limited by 50% in all directions bilaterally without pain      Strength   Overall Strength  Within functional limits for tasks performed    Overall Strength Comments  5/5 LE strength      Palpation   Spinal mobility  significant reduction in spinal mobility in thoracic and lumbar spine.  Pain reported with PA mobs L2-4    Palpation comment  palpable muscle tension and pain over Rt lumbar paraspinals and  quadratus.  No tenderness over Lt paraspinals or bil gluteals      Special Tests   Other special tests  no change in Rt LE numbness with lumbar flexion or repeated extension      Ambulation/Gait   Gait Pattern  Within Functional Limits             Objective measurements completed on examination: See above findings.              PT Education - 06/13/17 1635    Education provided  Yes    Education Details  knee to chest, trunk rotation, seated hamstring stretch    Person(s) Educated  Patient    Methods  Explanation;Handout;Demonstration;Verbal cues;Tactile cues    Comprehension  Verbalized understanding;Need further instruction       PT Short Term Goals - 06/13/17 1659      PT SHORT TERM GOAL #1   Title  be independent in initial HEP    Time  4    Period  Weeks    Status  New    Target Date  07/11/17      PT SHORT TERM GOAL #2   Title  verbalize and demonstrate correct body mechanics modifications for lumbar protection with home and work    Time  4    Period  Weeks    Status  New    Target Date  07/11/17      PT SHORT TERM GOAL #3   Title  report < or = to 5/10 max LBP and Rt LE pain with ADLs and self-care    Time  4    Period  Weeks    Status  New    Target Date  07/11/17        PT Long Term Goals - 06/13/17 1700      PT LONG TERM GOAL #1   Title  be independent in advanced HEP    Time  8    Period  Weeks    Status  New    Target Date  08/08/17      PT LONG TERM GOAL #2   Title  reduce FOTO to < or = to 30% limitation    Time  8    Period  Weeks    Status  New    Target Date  08/08/17      PT LONG TERM GOAL #3   Title  report < or = to 3/10 max LBP and Rt LE pain with standing tasks    Time  8    Period  Weeks    Status  New    Target Date  08/08/17      PT LONG TERM GOAL #4   Title  report a 60% reduction in Rt LE radiculopathy with home and work tasks    Time  8    Period  Weeks    Status  New    Target Date  08/08/17              Plan - 06/13/17 1653    Clinical Impression Statement  Pt presents to PT with chronic history of LBP and Rt LE "numbness" reported.  Pt reports that Rt LE symptoms worsened ~6 months ago and he saw MD last week.  X-ray was done and pt is not sure of result.  Pt demonstrates lumbar A/ROM with is WFLs with Rt lumbar pain reported with all lumbar motion.  Bil hip flexibilty and spinal mobility are significantly limited.  Pt with trigger points and palpable tenderness over Rt lumbar paraspinals and quadratus.  Pt sits with flexed spine and forward head posture.  Pt will benefit from skilled PT for hip and lumbar flexibility, core strength, dry needling/manual, spinal mobs, traction and body mechanics education to reduce pain with functional mobility.      History and Personal Factors relevant to plan of care:  chronic LBP    Clinical Presentation  Stable    Clinical Decision Making  Low    Rehab Potential  Good    PT Frequency  2x / week    PT Duration  8 weeks    PT Treatment/Interventions  ADLs/Self Care Home Management;Cryotherapy;Electrical Stimulation;Traction;Ultrasound;Functional mobility training;Therapeutic activities;Therapeutic exercise;Patient/family education;Neuromuscular re-education;Manual techniques;Passive range of motion;Taping;Dry needling    PT Next Visit Plan  Dry needling to lumbar multifidi and Rt quadratus/paraspinals, hip flexibility, review HEP as pt required max tactile and demo cueing for technique, body mechanics education    Consulted and Agree with Plan of Care  Patient       Patient will benefit from skilled therapeutic intervention in order to improve the following deficits and impairments:  Pain, Impaired flexibility, Hypomobility, Decreased range of motion, Decreased activity tolerance, Increased muscle spasms, Postural dysfunction  Visit Diagnosis: Chronic right-sided low back pain with right-sided sciatica - Plan: PT plan of care  cert/re-cert  Cramp and spasm - Plan: PT plan of care cert/re-cert  Stiffness of left hip, not elsewhere classified - Plan: PT plan of care cert/re-cert  Stiffness of right hip, not elsewhere classified - Plan: PT plan of care cert/re-cert  Abnormal posture - Plan: PT plan of care cert/re-cert     Problem List Patient Active Problem List   Diagnosis Date Noted  . Essential hypertension 06/18/2007     Sigurd Sos, PT 06/13/17 5:06 PM  [facility] [address] [place], [place], [address] Phone: [phone]   Fax:  [phone]  Name: Aaron Bowman MRN: [id number] Date of Birth: [birth year]

## 2017-06-13 NOTE — Patient Instructions (Addendum)
Lumbar Rotation: Caudal - Bilateral, Advanced (Supine)    Con los pies y rodillas juntos, brazos extendidos a los lados, lleve las rodillas hacia el pecho. Rote las rodillas hacia la izquierda llevando la cabeza en direccin opuesta, hasta sentir un estiramiento. Sostenga _20___ segundos. Descanse. Repita __3__ Vicenta Aly por rutina. Realice _0___ Albertina Senegal por sesin. Realice __3__ sesiones por da.  http://orth.exer.us/1022   Copyright  VHI. All rights reserved.  Knee to Chest (Flexion)    Agarre la rodilla y llvela hacia el pecho. Sienta un estiramiento en la parte inferior de la columna o en las glteos. Respirando profundamente, mantenga la posicin y cuente [place] _20___. Repita con la otra rodilla. Repita la serie _3___ veces. Haga __3__ sesiones por da.   HIP: Hamstrings - Short Sitting    Rest leg on raised surface. Keep knee straight. Lift chest. Hold _20__ seconds. _3_ reps per set, _1__ sets per day, ___ days per week  Copyright  VHI. All rights reserved.  [place] [address] [place], [place] [address] Phone # [phone] Fax [phone]

## 2017-06-14 ENCOUNTER — Ambulatory Visit: Payer: No Typology Code available for payment source | Admitting: Physical Therapy

## 2017-06-14 ENCOUNTER — Encounter: Payer: Self-pay | Admitting: Physical Therapy

## 2017-06-14 DIAGNOSIS — M25652 Stiffness of left hip, not elsewhere classified: Secondary | ICD-10-CM

## 2017-06-14 DIAGNOSIS — M5441 Lumbago with sciatica, right side: Principal | ICD-10-CM

## 2017-06-14 DIAGNOSIS — M25651 Stiffness of right hip, not elsewhere classified: Secondary | ICD-10-CM

## 2017-06-14 DIAGNOSIS — R252 Cramp and spasm: Secondary | ICD-10-CM

## 2017-06-14 DIAGNOSIS — R293 Abnormal posture: Secondary | ICD-10-CM

## 2017-06-14 DIAGNOSIS — G8929 Other chronic pain: Secondary | ICD-10-CM

## 2017-06-14 NOTE — Patient Instructions (Signed)
[facility] [address], [address] [place], [place] [address] Phone # [phone] Fax [phone]    

## 2017-06-14 NOTE — Therapy (Signed)
[facility] [address] [place], [place], [address] Phone: [phone]   Fax:  [phone]  Physical Therapy Treatment  Patient Details  Name: Aaron Bowman MRN: [id number] Date of Birth: [birth year] Referring Provider: Rod Can, MD   Encounter Date: 06/14/2017  PT End of Session - 06/14/17 1646    Visit Number  2    Date for PT Re-Evaluation  08/08/17    Authorization Type  UMR    PT Start Time  7408    PT Stop Time  1656    PT Time Calculation (min)  42 min    Activity Tolerance  Patient tolerated treatment well       Past Medical History:  Diagnosis Date  . Arthritis   . GERD (gastroesophageal reflux disease)   . Hypertension   . Tuberculosis     History reviewed. No pertinent surgical history.  There were no vitals filed for this visit.  Subjective Assessment - 06/14/17 1613    Subjective  No questions about ex;s;  complains of 8/10 pain today b/c he stopped taking medication.  I finished it on Monday.  Right back pain and posterior thigh pain to knee.  Toe numbness constantly.    (Pended)     Patient is accompained by:  Interpreter  (Pended)     Pertinent History  Pt speaks Spanish  (Pended)     Currently in Pain?  Yes  (Pended)     Pain Score  8   (Pended)     Pain Location  Back  (Pended)     Pain Orientation  Right  (Pended)     Pain Type  Chronic pain  (Pended)                       [facility] Adult PT Treatment/Exercise - 06/14/17 0001      Lumbar Exercises: Standing   Other Standing Lumbar Exercises  extensions 10x      Lumbar Exercises: Supine   Other Supine Lumbar Exercises  review of initial HEP      Lumbar Exercises: Prone   Other Prone Lumbar Exercises  press up 10x      Moist Heat Therapy   Number Minutes Moist Heat  12 Minutes    Moist Heat Location  Lumbar Spine      Electrical Stimulation   Electrical Stimulation Location  lumbar    Electrical Stimulation  Action  IFC    Electrical Stimulation Parameters  21 ma 12 min supine    Electrical Stimulation Goals  Pain      Manual Therapy   Joint Mobilization  right long axis hip distraction, AP in internal rotation; prone PA, prone PA in internal rotation and external rotation 3x 20 sec grade 3    Soft tissue mobilization  bil lumbar paraspinals; right gluteals and piriformis       Trigger Point Dry Needling - 06/14/17 1651    Consent Given?  Yes    Education Handout Provided  Yes    Muscles Treated Lower Body  Piriformis bil lumbar multifidi    Piriformis Response  Twitch response elicited;Palpable increased muscle length  Right side          PT Education - 06/14/17 1646    Education provided  Yes    Education Details  prone press ups, standing extensions;  dry needling after care    Person(s) Educated  Patient    Methods  Explanation;Demonstration;Handout  Comprehension  Returned demonstration;Verbalized understanding       PT Short Term Goals - 06/13/17 1659      PT SHORT TERM GOAL #1   Title  be independent in initial HEP    Time  4    Period  Weeks    Status  New    Target Date  07/11/17      PT SHORT TERM GOAL #2   Title  verbalize and demonstrate correct body mechanics modifications for lumbar protection with home and work    Time  4    Period  Weeks    Status  New    Target Date  07/11/17      PT SHORT TERM GOAL #3   Title  report < or = to 5/10 max LBP and Rt LE pain with ADLs and self-care    Time  4    Period  Weeks    Status  New    Target Date  07/11/17        PT Long Term Goals - 06/13/17 1700      PT LONG TERM GOAL #1   Title  be independent in advanced HEP    Time  8    Period  Weeks    Status  New    Target Date  08/08/17      PT LONG TERM GOAL #2   Title  reduce FOTO to < or = to 30% limitation    Time  8    Period  Weeks    Status  New    Target Date  08/08/17      PT LONG TERM GOAL #3   Title  report < or = to 3/10 max LBP and Rt  LE pain with standing tasks    Time  8    Period  Weeks    Status  New    Target Date  08/08/17      PT LONG TERM GOAL #4   Title  report a 60% reduction in Rt LE radiculopathy with home and work tasks    Time  8    Period  Weeks    Status  New    Target Date  08/08/17            Plan - 06/14/17 1651    Clinical Impression Statement  The patient presents with reports of "8/10" severe pain in right low back and right LE pain to the knee and numbness in the foot.  He relates the increase in his pain level to finishing his prenisone medication on Monday.  He is able to lie prone and peform prone press ups with stiffness but without pain.  Good response to dry needling with improved soft tissue length needed for manual therapy.  Needs cues to relax spinal and gluteal musculature for best response with prone press ups.  Following treatment session he reports he feels "much better" than when arriving today.      History and Personal Factors relevant to plan of care:  Interpreter present throughout treatment session    Rehab Potential  Good    PT Frequency  2x / week    PT Duration  8 weeks    PT Treatment/Interventions  ADLs/Self Care Home Management;Cryotherapy;Electrical Stimulation;Traction;Ultrasound;Functional mobility training;Therapeutic activities;Therapeutic exercise;Patient/family education;Neuromuscular re-education;Manual techniques;Passive range of motion;Taping;Dry needling    PT Next Visit Plan  assess response to DN #1;  assess response to prone press ups and progress;  hip mobs and  manual therapy;  ES/heat as needed for pain control; body mechanics education       Patient will benefit from skilled therapeutic intervention in order to improve the following deficits and impairments:  Pain, Impaired flexibility, Hypomobility, Decreased range of motion, Decreased activity tolerance, Increased muscle spasms, Postural dysfunction  Visit Diagnosis: Chronic right-sided low back  pain with right-sided sciatica  Cramp and spasm  Stiffness of left hip, not elsewhere classified  Stiffness of right hip, not elsewhere classified  Abnormal posture     Problem List Patient Active Problem List   Diagnosis Date Noted  . Essential hypertension 06/18/2007   Ruben Im, PT 06/14/17 5:07 PM Phone: [phone] Fax: [phone]  [name] 06/14/2017, 5:06 PM  [facility] [address] [place], [place], [address] Phone: [phone]   Fax:  [phone]  Name: Aaron Bowman MRN: [id number] Date of Birth: 12-Feb-1956

## 2017-06-21 ENCOUNTER — Encounter: Payer: Self-pay | Admitting: Physical Therapy

## 2017-06-21 ENCOUNTER — Ambulatory Visit: Payer: No Typology Code available for payment source | Admitting: Physical Therapy

## 2017-06-21 DIAGNOSIS — M25652 Stiffness of left hip, not elsewhere classified: Secondary | ICD-10-CM

## 2017-06-21 DIAGNOSIS — R252 Cramp and spasm: Secondary | ICD-10-CM

## 2017-06-21 DIAGNOSIS — M5441 Lumbago with sciatica, right side: Principal | ICD-10-CM

## 2017-06-21 DIAGNOSIS — M25651 Stiffness of right hip, not elsewhere classified: Secondary | ICD-10-CM

## 2017-06-21 DIAGNOSIS — G8929 Other chronic pain: Secondary | ICD-10-CM

## 2017-06-21 DIAGNOSIS — R293 Abnormal posture: Secondary | ICD-10-CM

## 2017-06-21 NOTE — Therapy (Signed)
[facility] [address] [place], [place], [address] Phone: [phone]   Fax:  [phone]  Physical Therapy Treatment  Patient Details  Name: Aaron Bowman MRN: [id number] Date of Birth: 1955-08-20 Referring Provider: Rod Can, MD   Encounter Date: 06/21/2017  PT End of Session - 06/21/17 1645    Visit Number  3    Date for PT Re-Evaluation  08/08/17    Authorization Type  UMR    PT Start Time  1615    PT Stop Time  1655    PT Time Calculation (min)  40 min    Activity Tolerance  Patient tolerated treatment well       Past Medical History:  Diagnosis Date  . Arthritis   . GERD (gastroesophageal reflux disease)   . Hypertension   . Tuberculosis     History reviewed. No pertinent surgical history.  There were no vitals filed for this visit.  Subjective Assessment - 06/21/17 1611    Subjective  The dry needling was OK but the next day after the DN it felt better but the pain came back.  The pain is almost the same.  Intermittent right LE radiculopathy.  Comes on with standing.  Relieved with sitting.      Patient is accompained by:  Interpreter    Pertinent History  Pt speaks Spanish    Currently in Pain?  No/denies    Pain Score  0-No pain    Pain Location  Back                      [facility] Adult PT Treatment/Exercise - 06/21/17 0001      Lumbar Exercises: Standing   Other Standing Lumbar Exercises  abdominal brace against wall 5x    Other Standing Lumbar Exercises  abdominal brace with toe tap and marching against wall 10x each      Lumbar Exercises: Supine   Ab Set  5 reps    Bent Knee Raise  5 reps    Isometric Hip Flexion  5 reps      Traction   Type of Traction  Lumbar    Min (lbs)  50    Max (lbs)  90 initially set on 75 but patient requested increase in pull    Hold Time  15    Rest Time  15    Time  15             PT Education - 06/21/17 1640    Education  provided  Yes    Education Details  abdominal brace in supine and standing    Person(s) Educated  Patient    Methods  Explanation;Demonstration;Handout    Comprehension  Returned demonstration;Verbalized understanding       PT Short Term Goals - 06/13/17 1659      PT SHORT TERM GOAL #1   Title  be independent in initial HEP    Time  4    Period  Weeks    Status  New    Target Date  07/11/17      PT SHORT TERM GOAL #2   Title  verbalize and demonstrate correct body mechanics modifications for lumbar protection with home and work    Time  4    Period  Weeks    Status  New    Target Date  07/11/17      PT SHORT TERM GOAL #3   Title  report <  or = to 5/10 max LBP and Rt LE pain with ADLs and self-care    Time  4    Period  Weeks    Status  New    Target Date  07/11/17        PT Long Term Goals - 06/13/17 1700      PT LONG TERM GOAL #1   Title  be independent in advanced HEP    Time  8    Period  Weeks    Status  New    Target Date  08/08/17      PT LONG TERM GOAL #2   Title  reduce FOTO to < or = to 30% limitation    Time  8    Period  Weeks    Status  New    Target Date  08/08/17      PT LONG TERM GOAL #3   Title  report < or = to 3/10 max LBP and Rt LE pain with standing tasks    Time  8    Period  Weeks    Status  New    Target Date  08/08/17      PT LONG TERM GOAL #4   Title  report a 60% reduction in Rt LE radiculopathy with home and work tasks    Time  8    Period  Weeks    Status  New    Target Date  08/08/17            Plan - 06/21/17 1645    Clinical Impression Statement  The patient presents without complaint of low back pain or LE symptoms but with standing right low back pain produced.  He reports short term relief of 1 day with dry needling last session and would like to try other treatments.  Initiated mechanical lumbar traction with patient requesting additional pull during treatment.  Decreased the importance of core strength to  stabilize lumbar spine in standing.  Verbal cues to avoid holding his breath with transverse abdominus muscle activation.   Patient states, "how many sessions before I start to feel better?"  Anticipate he should have progress in the next few sessions.       History and Personal Factors relevant to plan of care:  Intrepeter present throughout treatment session    Rehab Potential  Good    PT Frequency  2x / week    PT Duration  8 weeks    PT Treatment/Interventions  ADLs/Self Care Home Management;Cryotherapy;Electrical Stimulation;Traction;Ultrasound;Functional mobility training;Therapeutic activities;Therapeutic exercise;Patient/family education;Neuromuscular re-education;Manual techniques;Passive range of motion;Taping;Dry needling    PT Next Visit Plan  assess response to lumbar traction and continue if helps to centralize symptoms;  neutral spine stablization;  ES/heat as needed;  hip mobs and manual therapy       Patient will benefit from skilled therapeutic intervention in order to improve the following deficits and impairments:  Pain, Impaired flexibility, Hypomobility, Decreased range of motion, Decreased activity tolerance, Increased muscle spasms, Postural dysfunction  Visit Diagnosis: Chronic right-sided low back pain with right-sided sciatica  Cramp and spasm  Stiffness of left hip, not elsewhere classified  Stiffness of right hip, not elsewhere classified  Abnormal posture     Problem List Patient Active Problem List   Diagnosis Date Noted  . Essential hypertension 06/18/2007   Ruben Im, PT 06/21/17 5:06 PM Phone: [phone] Fax: [phone]  Aaron Bowman 06/21/2017, 5:05 PM  [facility] [address]  [place], [place], [place], [address] Phone: [phone]   Fax:  [phone]  Name: Aaron Bowman MRN: [id number] Date of Birth: 14-Jun-1955

## 2017-06-21 NOTE — Patient Instructions (Signed)
Stacy Simpson PT [facility] [address], [address] [place], [place] [address] Phone # [phone] Fax [phone]    

## 2017-06-25 ENCOUNTER — Ambulatory Visit: Payer: No Typology Code available for payment source

## 2017-06-25 DIAGNOSIS — M25652 Stiffness of left hip, not elsewhere classified: Secondary | ICD-10-CM

## 2017-06-25 DIAGNOSIS — M5441 Lumbago with sciatica, right side: Secondary | ICD-10-CM | POA: Diagnosis not present

## 2017-06-25 DIAGNOSIS — R252 Cramp and spasm: Secondary | ICD-10-CM

## 2017-06-25 DIAGNOSIS — R293 Abnormal posture: Secondary | ICD-10-CM

## 2017-06-25 DIAGNOSIS — G8929 Other chronic pain: Secondary | ICD-10-CM

## 2017-06-25 DIAGNOSIS — M25651 Stiffness of right hip, not elsewhere classified: Secondary | ICD-10-CM

## 2017-06-25 NOTE — Therapy (Signed)
[facility] [address], [place], [place], [address] Phone: [phone]   Fax:  [phone]  Physical Therapy Treatment  Patient Details  Name: Aaron Bowman MRN: [id number] Date of Birth: February 17, 1956 Referring Provider: Rod Can, MD   Encounter Date: 06/25/2017  PT End of Session - 06/25/17 1648    Visit Number  4    Date for PT Re-Evaluation  08/08/17    Authorization Type  UMR    PT Start Time  1616    PT Stop Time  1702    PT Time Calculation (min)  46 min    Activity Tolerance  Patient tolerated treatment well    Behavior During Therapy  [facility] for tasks assessed/performed       Past Medical History:  Diagnosis Date  . Arthritis   . GERD (gastroesophageal reflux disease)   . Hypertension   . Tuberculosis     History reviewed. No pertinent surgical history.  There were no vitals filed for this visit.  Subjective Assessment - 06/25/17 1615    Subjective  No changes.  "I feel regular" I want to try the traction again    Patient is accompained by:  Interpreter    Currently in Pain?  No/denies                      [facility] Adult PT Treatment/Exercise - 06/25/17 0001      Exercises   Exercises  Lumbar      Lumbar Exercises: Aerobic   Nustep  Level 2x 8 minutes PT present to discuss progress      Lumbar Exercises: Supine   Ab Set  5 reps    Other Supine Lumbar Exercises  review of initial HEP- due to limited compliance      Lumbar Exercises: Prone   Other Prone Lumbar Exercises  press up 10x      Traction   Type of Traction  Lumbar    Min (lbs)  50    Max (lbs)  100    Hold Time  60    Rest Time  10    Time  15             PT Education - 06/25/17 1630    Education provided  Yes    Education Details  body mechanics education    Person(s) Educated  Patient    Methods  Explanation;Handout    Comprehension  Verbalized understanding;Returned demonstration       PT  Short Term Goals - 06/25/17 1618      PT SHORT TERM GOAL #1   Title  be independent in initial HEP    Status  Achieved      PT SHORT TERM GOAL #2   Title  verbalize and demonstrate correct body mechanics modifications for lumbar protection with home and work    Status  Achieved        PT Long Term Goals - 06/13/17 1700      PT LONG TERM GOAL #1   Title  be independent in advanced HEP    Time  8    Period  Weeks    Status  New    Target Date  08/08/17      PT LONG TERM GOAL #2   Title  reduce FOTO to < or = to 30% limitation    Time  8    Period  Weeks    Status  New  Target Date  08/08/17      PT LONG TERM GOAL #3   Title  report < or = to 3/10 max LBP and Rt LE pain with standing tasks    Time  8    Period  Weeks    Status  New    Target Date  08/08/17      PT LONG TERM GOAL #4   Title  report a 60% reduction in Rt LE radiculopathy with home and work tasks    Time  8    Period  Weeks    Status  New    Target Date  08/08/17            Plan - 06/25/17 1620    Clinical Impression Statement  Pt denies any change with symptoms since the start of care.  Pt reports no pain over the past few days.  Pt reports minimal compliance with HEP.  Session focused on review of all HEP and traction to address LE radiculopathy.  PT provided body mechanics education for pt for lumbar protections.  Pt will continue to benefit from skilled PT to reduce LE radiculopathy, improve flexibility and improve core strengt.h.      Rehab Potential  Good    PT Frequency  2x / week    PT Duration  8 weeks    PT Treatment/Interventions  ADLs/Self Care Home Management;Cryotherapy;Electrical Stimulation;Traction;Ultrasound;Functional mobility training;Therapeutic activities;Therapeutic exercise;Patient/family education;Neuromuscular re-education;Manual techniques;Passive range of motion;Taping;Dry needling    PT Next Visit Plan  assess response to lumbar traction, core strength, traction if  helpful, flexibility    Consulted and Agree with Plan of Care  Patient       Patient will benefit from skilled therapeutic intervention in order to improve the following deficits and impairments:  Pain, Impaired flexibility, Hypomobility, Decreased range of motion, Decreased activity tolerance, Increased muscle spasms, Postural dysfunction  Visit Diagnosis: Chronic right-sided low back pain with right-sided sciatica  Cramp and spasm  Stiffness of left hip, not elsewhere classified  Stiffness of right hip, not elsewhere classified  Abnormal posture     Problem List Patient Active Problem List   Diagnosis Date Noted  . Essential hypertension 06/18/2007    Sigurd Sos, PT 06/25/17 4:50 PM  [facility] [address] [place], [place], [address] Phone: [phone]   Fax:  [phone]  Name: Aaron Bowman MRN: [id number] Date of Birth: Apr 09, 1956

## 2017-06-25 NOTE — Patient Instructions (Addendum)
   Lifting Principles  Maintain proper posture and head alignment. Slide object as close as possible before lifting. Move obstacles out of the way. Test before lifting; ask for help if too heavy. Tighten stomach muscles without holding breath. Use smooth movements; do not jerk. Use legs to do the work, and pivot with feet. Distribute the work load symmetrically and close to the center of trunk. Push instead of pull whenever possible.   Squat down and hold basket close to stand. Use leg muscles to do the work.    Avoid twisting or bending back. Pivot around using foot movements, and bend at knees if needed when reaching for articles.        Getting Into / Out of Bed   Lower self to lie down on one side by raising legs and lowering head at the same time. Use arms to assist moving without twisting. Bend both knees to roll onto back if desired. To sit up, start from lying on side, and use same move-ments in reverse. Keep trunk aligned with legs.    Shift weight from front foot to back foot as item is lifted off shelf.    When leaning forward to pick object up from floor, extend one leg out behind. Keep back straight. Hold onto a sturdy support with other hand.      Sit upright, head facing forward. Try using a roll to support lower back. Keep shoulders relaxed, and avoid rounded back. Keep hips level with knees. Avoid crossing legs for long periods.     [facility] [address] [place], [place] [address] Phone # [phone] Fax [phone]  

## 2017-06-27 ENCOUNTER — Ambulatory Visit: Payer: No Typology Code available for payment source

## 2017-06-27 DIAGNOSIS — R293 Abnormal posture: Secondary | ICD-10-CM

## 2017-06-27 DIAGNOSIS — M5441 Lumbago with sciatica, right side: Secondary | ICD-10-CM | POA: Diagnosis not present

## 2017-06-27 DIAGNOSIS — R252 Cramp and spasm: Secondary | ICD-10-CM

## 2017-06-27 DIAGNOSIS — G8929 Other chronic pain: Secondary | ICD-10-CM

## 2017-06-27 DIAGNOSIS — M25651 Stiffness of right hip, not elsewhere classified: Secondary | ICD-10-CM

## 2017-06-27 DIAGNOSIS — M25652 Stiffness of left hip, not elsewhere classified: Secondary | ICD-10-CM

## 2017-06-27 NOTE — Therapy (Addendum)
[facility] [address] [place], [place], [address] Phone: [phone]   Fax:  [phone]  Physical Therapy Treatment  Patient Details  Name: Aaron Bowman MRN: [id number] Date of Birth: 12-07-1955 Referring Provider: Rod Can, MD   Encounter Date: 06/27/2017  PT End of Session - 06/27/17 1644    Visit Number  5    Date for PT Re-Evaluation  08/08/17    Authorization Type  UMR    PT Start Time  1610    PT Stop Time  1659    PT Time Calculation (min)  49 min    Activity Tolerance  Patient tolerated treatment well    Behavior During Therapy  [facility] for tasks assessed/performed       Past Medical History:  Diagnosis Date  . Arthritis   . GERD (gastroesophageal reflux disease)   . Hypertension   . Tuberculosis     History reviewed. No pertinent surgical history.  There were no vitals filed for this visit.  Subjective Assessment - 06/27/17 1618    Subjective  I think the traction helped.      Currently in Pain?  No/denies                      [facility] Adult PT Treatment/Exercise - 06/27/17 0001      Lumbar Exercises: Stretches   Single Knee to Chest Stretch  Right;Left;20 seconds;3 reps    Figure 4 Stretch  3 reps;20 seconds;Seated      Lumbar Exercises: Aerobic   Nustep  Level 2x 8 minutes PT present to discuss progress      Lumbar Exercises: Supine   Ab Set  5 reps    Bent Knee Raise  20 reps one leg then both, both lower together    Bridge  15 reps      Lumbar Exercises: Prone   Straight Leg Raise  20 reps with abdominal bracing      Traction   Type of Traction  Lumbar    Min (lbs)  50    Max (lbs)  110    Hold Time  60    Rest Time  10    Time  15               PT Short Term Goals - 06/25/17 1618      PT SHORT TERM GOAL #1   Title  be independent in initial HEP    Status  Achieved      PT SHORT TERM GOAL #2   Title  verbalize and demonstrate correct body  mechanics modifications for lumbar protection with home and work    Status  Achieved        PT Long Term Goals - 06/13/17 1700      PT LONG TERM GOAL #1   Title  be independent in advanced HEP    Time  8    Period  Weeks    Status  New    Target Date  08/08/17      PT LONG TERM GOAL #2   Title  reduce FOTO to < or = to 30% limitation    Time  8    Period  Weeks    Status  New    Target Date  08/08/17      PT LONG TERM GOAL #3   Title  report < or = to 3/10 max LBP and Rt LE pain with standing tasks  Time  8    Period  Weeks    Status  New    Target Date  08/08/17      PT LONG TERM GOAL #4   Title  report a 60% reduction in Rt LE radiculopathy with home and work tasks    Time  8    Period  Weeks    Status  New    Target Date  08/08/17            Plan - 06/27/17 1622    Clinical Impression Statement  Pt denies any pain today.  Pt tolerated all exercise including flexibility and extension based exercise well today without any increased pain.  Pt reports that he goes to the gym after treatment.  Pt reports that his leg remains numb and denies any change in this.  Pt is making body mechanics modifiacations for lumbar protection at home.  Pt will continue to benefit from skilled PT to reduce LE radiculopathy, improve flexibility and improve core strength.      Rehab Potential  Good    PT Frequency  2x / week    PT Duration  8 weeks    PT Treatment/Interventions  ADLs/Self Care Home Management;Cryotherapy;Electrical Stimulation;Traction;Ultrasound;Functional mobility training;Therapeutic activities;Therapeutic exercise;Patient/family education;Neuromuscular re-education;Manual techniques;Passive range of motion;Taping;Dry needling    PT Next Visit Plan  assess response to lumbar traction, core strength, traction if helpful, flexibility    Recommended Other Services  initial certification is signed    Consulted and Agree with Plan of Care  Patient       Patient will  benefit from skilled therapeutic intervention in order to improve the following deficits and impairments:  Pain, Impaired flexibility, Hypomobility, Decreased range of motion, Decreased activity tolerance, Increased muscle spasms, Postural dysfunction  Visit Diagnosis: Chronic right-sided low back pain with right-sided sciatica  Cramp and spasm  Stiffness of left hip, not elsewhere classified  Stiffness of right hip, not elsewhere classified  Abnormal posture     Problem List Patient Active Problem List   Diagnosis Date Noted  . Essential hypertension 06/18/2007    Sigurd Sos, PT 06/27/17 4:48 PM PHYSICAL THERAPY DISCHARGE SUMMARY  Visits from Start of Care: 5  Current functional level related to goals / functional outcomes: Pt requested to be put on hold until the end of his plan of care as he was not likely to return to PT due to travel.  See above for most current PT status.     Remaining deficits: See above.   Education / Equipment: HEP, Economist education Plan: Patient agrees to discharge.  Patient goals were partially met. Patient is being discharged due to the patient's request.  ?????        Sigurd Sos, PT 07/31/17 7:25 AM  [facility] [address], [place], [place], [address] Phone: [phone]   Fax:  [phone]  Name: Andray Assefa MRN: [id number] Date of Birth: [birth year]

## 2017-07-05 ENCOUNTER — Encounter: Payer: Self-pay | Admitting: Physical Therapy

## 2017-07-09 ENCOUNTER — Encounter: Payer: Self-pay | Admitting: Gastroenterology

## 2017-07-09 ENCOUNTER — Encounter: Payer: Self-pay | Admitting: Physician Assistant

## 2017-09-06 ENCOUNTER — Other Ambulatory Visit: Payer: Self-pay | Admitting: Emergency Medicine

## 2017-09-06 DIAGNOSIS — I10 Essential (primary) hypertension: Secondary | ICD-10-CM

## 2017-09-07 NOTE — Telephone Encounter (Signed)
Interface request refill on Lisinopril  20/25 mg 1/tablet /day   # 90 3 refills   Last refill 06/19/17  /// LOV  05/14/17 with Dr.  Doroteo Bradford

## 2018-01-15 ENCOUNTER — Other Ambulatory Visit: Payer: Self-pay | Admitting: Emergency Medicine

## 2018-01-15 ENCOUNTER — Telehealth: Payer: Self-pay | Admitting: Emergency Medicine

## 2018-01-15 DIAGNOSIS — Z8669 Personal history of other diseases of the nervous system and sense organs: Secondary | ICD-10-CM

## 2018-01-15 NOTE — Telephone Encounter (Signed)
Referral placed  Thanks!

## 2018-01-15 NOTE — Telephone Encounter (Signed)
Looks like Dr, Mitchel Honour is listed as his pcp..please advise  Copied from [place] [phone]. Topic: Referral - Request >> Jan 15, 2018  9:40 AM Alfredia Ferguson R wrote: [facility] is calling in needed a referral to continue seeing patient for his glaucoma due to insurance change to [facility] plan  Fax# [phone]

## 2018-01-21 ENCOUNTER — Telehealth: Payer: Self-pay | Admitting: *Deleted

## 2018-01-21 ENCOUNTER — Ambulatory Visit: Payer: Self-pay | Admitting: *Deleted

## 2018-01-21 ENCOUNTER — Telehealth: Payer: Self-pay | Admitting: Emergency Medicine

## 2018-01-21 NOTE — Telephone Encounter (Signed)
error 

## 2018-01-21 NOTE — Telephone Encounter (Signed)
He called in c/o having an elevated BP 150/102 this morning and he is taking his BP medication.  He requested to see Dr. Mitchel Honour or Dr. Pamella Pert.   There is an appt available for 01/22/18 at 12:00 with Dr. Pamella Pert however I am being blocked due to "needing security clearance". I let the pt know I would have someone from Primary Care at [facility] call him to make the appt.   He was agreeable to this plan.  I sent a note to [place].   I attempted to call twice without an answer.   Reason for Disposition . [0] Systolic BP  >= 086 OR Diastolic >= 80 AND [7] taking BP medications  Answer Assessment - Initial Assessment Questions 1. BLOOD PRESSURE: "What is the blood pressure?" "Did you take at least two measurements 5 minutes apart?"     BP 150/102 this morning 2. ONSET: "When did you take your blood pressure?"     This morning.  Denies dizziness or headaches.    3. HOW: "How did you obtain the blood pressure?" (e.g., visiting nurse, automatic home BP monitor)     Home monitor. 4. HISTORY: "Do you have a history of high blood pressure?"     Yes 5. MEDICATIONS: "Are you taking any medications for blood pressure?" "Have you missed any doses recently?"     Yes.   6. OTHER SYMPTOMS: "Do you have any symptoms?" (e.g., headache, chest pain, blurred vision, difficulty breathing, weakness)     None of above 7. PREGNANCY: "Is there any chance you are pregnant?" "When was your last menstrual period?"     N/A  Protocols used: HIGH BLOOD PRESSURE-A-AH

## 2018-01-21 NOTE — Telephone Encounter (Signed)
Patient called back to inquire about appointment to see Dr Mitchel Honour on 01/22/18 said he spoke with someone this a.m.. Stated that he checked his Blood Pressure and its still high. And is expecting a call back. Ph# [phone]

## 2018-01-21 NOTE — Telephone Encounter (Signed)
Pt calling again; triaged this am. Interrupter on line.    States BP now 160/108.  [place] spoke with Hassan Rowan earlier in attempts to secure appt. Stated will call pt to schedule. Pt presently denies any other symptoms. Did direct pt to UC if headache occurs, visual changes, weakness. Please see earlier triage encounter.  Please advise: [phone]

## 2018-01-21 NOTE — Telephone Encounter (Signed)
Called pt through the interpreter services. I was able to schedule an appt with Dr. Mitchel Honour on Wednesday 10/16 at 4:00 pm.  Advised pt of time, building and late policy.

## 2018-01-21 NOTE — Telephone Encounter (Signed)
Pt has appt 10/16.

## 2018-01-21 NOTE — Telephone Encounter (Signed)
I called Primary Care at [facility] and let them know I had attempted to get an appt for this pt but was getting messages I needed security clearance with my attempts.  I spoke with Hassan Rowan, flow coordinator and she is going to contact the pt for the appt.  I thanked Hassan Rowan for her help.

## 2018-01-23 ENCOUNTER — Ambulatory Visit (INDEPENDENT_AMBULATORY_CARE_PROVIDER_SITE_OTHER): Payer: No Typology Code available for payment source | Admitting: Emergency Medicine

## 2018-01-23 ENCOUNTER — Encounter: Payer: Self-pay | Admitting: Emergency Medicine

## 2018-01-23 VITALS — BP 162/93 | HR 77 | Temp 98.1°F | Resp 16 | Ht 67.0 in | Wt 202.0 lb

## 2018-01-23 DIAGNOSIS — I10 Essential (primary) hypertension: Secondary | ICD-10-CM | POA: Diagnosis not present

## 2018-01-23 DIAGNOSIS — M5431 Sciatica, right side: Secondary | ICD-10-CM | POA: Diagnosis not present

## 2018-01-23 DIAGNOSIS — Z23 Encounter for immunization: Secondary | ICD-10-CM

## 2018-01-23 MED ORDER — AMLODIPINE BESYLATE 5 MG PO TABS: 5.0000 mg | ORAL_TABLET | Freq: Every day | ORAL | 3 refills | Status: DC

## 2018-01-23 MED ORDER — LISINOPRIL-HYDROCHLOROTHIAZIDE 20-12.5 MG PO TABS
1.0000 | ORAL_TABLET | Freq: Every day | ORAL | 3 refills | Status: DC
Start: 2018-01-23 — End: 2018-03-28

## 2018-01-23 MED ORDER — LIDOCAINE 5 % EX PTCH: 1.0000 | MEDICATED_PATCH | CUTANEOUS | 0 refills | Status: DC

## 2018-01-23 NOTE — Assessment & Plan Note (Signed)
Uncontrolled blood pressure.  Noncompliant with medications.  Advised to start amlodipine 5 mg a day.  Will change Zestoretic to 20/12.5.  Continue diet and exercise.  Follow-up in 3 to 6 months.

## 2018-01-23 NOTE — Patient Instructions (Addendum)
   If you have lab work done today you will be contacted with your lab results within the next 2 weeks.  If you have not heard from us then please contact us. The fastest way to get your results is to register for My Chart.   IF you received an x-ray today, you will receive an invoice from [facility]. Please contact [facility] at [phone] with questions or concerns regarding your invoice.   IF you received labwork today, you will receive an invoice from [facility]. Please contact [facility] at [phone] with questions or concerns regarding your invoice.   Our billing staff will not be able to assist you with questions regarding bills from these companies.  You will be contacted with the lab results as soon as they are available. The fastest way to get your results is to activate your My Chart account. Instructions are located on the last page of this paperwork. If you have not heard from us regarding the results in 2 weeks, please contact this office.     Hypertension Hypertension, commonly called high blood pressure, is when the force of blood pumping through the arteries is too strong. The arteries are the blood vessels that carry blood from the heart throughout the body. Hypertension forces the heart to work harder to pump blood and may cause arteries to become narrow or stiff. Having untreated or uncontrolled hypertension can cause heart attacks, strokes, kidney disease, and other problems. A blood pressure reading consists of a higher number over a lower number. Ideally, your blood pressure should be below 120/80. The first ("top") number is called the systolic pressure. It is a measure of the pressure in your arteries as your heart beats. The second ("bottom") number is called the diastolic pressure. It is a measure of the pressure in your arteries as the heart relaxes. What are the causes? The cause of this condition is not known. What increases the risk? Some  risk factors for high blood pressure are under your control. Others are not. Factors you can change  Smoking.  Having type 2 diabetes mellitus, high cholesterol, or both.  Not getting enough exercise or physical activity.  Being overweight.  Having too much fat, sugar, calories, or salt (sodium) in your diet.  Drinking too much alcohol. Factors that are difficult or impossible to change  Having chronic kidney disease.  Having a family history of high blood pressure.  Age. Risk increases with age.  Race. You may be at higher risk if you are African-American.  Gender. Men are at higher risk than women before age 45. After age 65, women are at higher risk than men.  Having obstructive sleep apnea.  Stress. What are the signs or symptoms? Extremely high blood pressure (hypertensive crisis) may cause:  Headache.  Anxiety.  Shortness of breath.  Nosebleed.  Nausea and vomiting.  Severe chest pain.  Jerky movements you cannot control (seizures).  How is this diagnosed? This condition is diagnosed by measuring your blood pressure while you are seated, with your arm resting on a surface. The cuff of the blood pressure monitor will be placed directly against the skin of your upper arm at the level of your heart. It should be measured at least twice using the same arm. Certain conditions can cause a difference in blood pressure between your right and left arms. Certain factors can cause blood pressure readings to be lower or higher than normal (elevated) for a short period of time:  When   your blood pressure is higher when you are in a health care provider's office than when you are at home, this is called white coat hypertension. Most people with this condition do not need medicines.  When your blood pressure is higher at home than when you are in a health care provider's office, this is called masked hypertension. Most people with this condition may need medicines to control  blood pressure.  If you have a high blood pressure reading during one visit or you have normal blood pressure with other risk factors:  You may be asked to return on a different day to have your blood pressure checked again.  You may be asked to monitor your blood pressure at home for 1 week or longer.  If you are diagnosed with hypertension, you may have other blood or imaging tests to help your health care provider understand your overall risk for other conditions. How is this treated? This condition is treated by making healthy lifestyle changes, such as eating healthy foods, exercising more, and reducing your alcohol intake. Your health care provider may prescribe medicine if lifestyle changes are not enough to get your blood pressure under control, and if:  Your systolic blood pressure is above 130.  Your diastolic blood pressure is above 80.  Your personal target blood pressure may vary depending on your medical conditions, your age, and other factors. Follow these instructions at home: Eating and drinking  Eat a diet that is high in fiber and potassium, and low in sodium, added sugar, and fat. An example eating plan is called the DASH (Dietary Approaches to Stop Hypertension) diet. To eat this way: ? Eat plenty of fresh fruits and vegetables. Try to fill half of your plate at each meal with fruits and vegetables. ? Eat whole grains, such as whole wheat pasta, brown rice, or whole grain bread. Fill about one quarter of your plate with whole grains. ? Eat or drink low-fat dairy products, such as skim milk or low-fat yogurt. ? Avoid fatty cuts of meat, processed or cured meats, and poultry with skin. Fill about one quarter of your plate with lean proteins, such as fish, chicken without skin, beans, eggs, and tofu. ? Avoid premade and processed foods. These tend to be higher in sodium, added sugar, and fat.  Reduce your daily sodium intake. Most people with hypertension should eat less  than 1,500 mg of sodium a day.  Limit alcohol intake to no more than 1 drink a day for nonpregnant women and 2 drinks a day for men. One drink equals 12 oz of beer, 5 oz of wine, or 1 oz of hard liquor. Lifestyle  Work with your health care provider to maintain a healthy body weight or to lose weight. Ask what an ideal weight is for you.  Get at least 30 minutes of exercise that causes your heart to beat faster (aerobic exercise) most days of the week. Activities may include walking, swimming, or biking.  Include exercise to strengthen your muscles (resistance exercise), such as pilates or lifting weights, as part of your weekly exercise routine. Try to do these types of exercises for 30 minutes at least 3 days a week.  Do not use any products that contain nicotine or tobacco, such as cigarettes and e-cigarettes. If you need help quitting, ask your health care provider.  Monitor your blood pressure at home as told by your health care provider.  Keep all follow-up visits as told by your health care provider.   This is important. Medicines  Take over-the-counter and prescription medicines only as told by your health care provider. Follow directions carefully. Blood pressure medicines must be taken as prescribed.  Do not skip doses of blood pressure medicine. Doing this puts you at risk for problems and can make the medicine less effective.  Ask your health care provider about side effects or reactions to medicines that you should watch for. Contact a health care provider if:  You think you are having a reaction to a medicine you are taking.  You have headaches that keep coming back (recurring).  You feel dizzy.  You have swelling in your ankles.  You have trouble with your vision. Get help right away if:  You develop a severe headache or confusion.  You have unusual weakness or numbness.  You feel faint.  You have severe pain in your chest or abdomen.  You vomit  repeatedly.  You have trouble breathing. Summary  Hypertension is when the force of blood pumping through your arteries is too strong. If this condition is not controlled, it may put you at risk for serious complications.  Your personal target blood pressure may vary depending on your medical conditions, your age, and other factors. For most people, a normal blood pressure is less than 120/80.  Hypertension is treated with lifestyle changes, medicines, or a combination of both. Lifestyle changes include weight loss, eating a healthy, low-sodium diet, exercising more, and limiting alcohol. This information is not intended to replace advice given to you by your health care provider. Make sure you discuss any questions you have with your health care provider. Document Released: 03/27/2005 Document Revised: 02/23/2016 Document Reviewed: 02/23/2016 [employer] Interactive Patient Education  2018 [employer]  

## 2018-01-23 NOTE — Progress Notes (Signed)
Aaron Bowman 62 y.o.   Chief Complaint  Patient presents with  . Hypertension    pt states his BP has been elevated lately and he takes BP meds. 162/93    HISTORY OF PRESENT ILLNESS: This is a 62 y.o. male with history of hypertension here for follow-up.  Blood pressure has been high at home.  Has not been taking amlodipine.  Only taking Zestoretic 20/25. Compliant with diet and exercise.  Non-smoker and non-EtOH abuse.  BP Readings from Last 3 Encounters:  01/23/18 (!) 162/93  06/04/17 126/86  05/14/17 124/84     HPI   Prior to Admission medications   Medication Sig Start Date End Date Taking? Authorizing Provider  amLODipine (NORVASC) 5 MG tablet Take 1 tablet (5 mg total) by mouth daily. 05/14/17  Yes Horald Pollen, MD  aspirin EC 81 MG tablet Take 81 mg by mouth daily.   Yes [provider]  cholecalciferol (VITAMIN D) 400 units TABS tablet Take 400 Units by mouth daily.   Yes [provider]  cyclobenzaprine (FLEXERIL) 5 MG tablet Take 1 tablet (5 mg total) by mouth 3 (three) times daily as needed. 01/06/16  Yes Jaynee Eagles, PA-C  ferrous sulfate 325 (65 FE) MG EC tablet Take 325 mg by mouth 1 day or 1 dose.   Yes [provider]  lisinopril-hydrochlorothiazide (PRINZIDE,ZESTORETIC) 20-25 MG tablet TAKE 1 TABLET BY MOUTH ONCE DAILY 09/07/17  Yes Sakari Raisanen, Ines Bloomer, MD  meloxicam (MOBIC) 7.5 MG tablet Take 1 tablet (7.5 mg total) by mouth daily. 08/09/16  Yes Elma Shands, Ines Bloomer, MD  Omega-3 Fatty Acids (FISH OIL) 1000 MG CAPS Take 1,000 mg by mouth.   Yes [provider]  ondansetron (ZOFRAN ODT) 4 MG disintegrating tablet Take 1 tablet (4 mg total) by mouth every 8 (eight) hours as needed for nausea or vomiting. 06/04/17  Yes English, Stephanie D, PA  vitamin B-12 (CYANOCOBALAMIN) 100 MCG tablet Take 100 mcg by mouth daily.   Yes [provider]  vitamin E 1000 UNIT capsule Take 1,000 Units by mouth daily.   Yes [provider]    No Known Allergies  Patient Active Problem List   Diagnosis Date Noted  . Essential hypertension 06/18/2007    Past Medical History:  Diagnosis Date  . Arthritis   . GERD (gastroesophageal reflux disease)   . Hypertension   . Tuberculosis     No past surgical history on file.  Social History   Socioeconomic History  . Marital status: Married    Spouse name: Not on file  . Number of children: Not on file  . Years of education: Not on file  . Highest education level: Not on file  Occupational History  . Occupation: Glass blower/designer  Social Needs  . Financial resource strain: Not on file  . Food insecurity:    Worry: Not on file    Inability: Not on file  . Transportation needs:    Medical: Not on file    Non-medical: Not on file  Tobacco Use  . Smoking status: Never Smoker  . Smokeless tobacco: Never Used  Substance and Sexual Activity  . Alcohol use: Yes    Alcohol/week: 5.0 standard drinks    Types: 5 Cans of beer per week  . Drug use: No  . Sexual activity: Not on file  Lifestyle  . Physical activity:    Days per week: Not on file    Minutes per session: Not on file  .  Stress: Not on file  Relationships  . Social connections:    Talks on phone: Not on file    Gets together: Not on file    Attends religious service: Not on file    Active member of club or organization: Not on file    Attends meetings of clubs or organizations: Not on file    Relationship status: Not on file  . Intimate partner violence:    Fear of current or ex partner: Not on file    Emotionally abused: Not on file    Physically abused: Not on file    Forced sexual activity: Not on file  Other Topics Concern  . Not on file  Social History Narrative  . Not on file    Family History  Problem Relation Age of Onset  . Hypertension Mother   . Cancer Sister   . Cancer Brother   . Cancer Brother      Review of Systems  Constitutional: Negative.  Negative for  chills and fever.  HENT: Negative.  Negative for sore throat.   Eyes: Negative.  Negative for blurred vision and double vision.  Respiratory: Negative.  Negative for cough and shortness of breath.   Cardiovascular: Negative.  Negative for chest pain and palpitations.  Gastrointestinal: Negative.  Negative for abdominal pain, diarrhea, nausea and vomiting.  Genitourinary: Negative.   Musculoskeletal: Positive for back pain (Right sided with sciatica). Negative for myalgias and neck pain.  Skin: Negative.  Negative for rash.  Neurological: Negative.  Negative for dizziness and headaches.  Endo/Heme/Allergies: Negative.   All other systems reviewed and are negative.   Vitals:   01/23/18 1553  BP: (!) 162/93  Pulse: 77  Resp: 16  Temp: 98.1 F (36.7 C)  SpO2: 100%    Physical Exam  Constitutional: He is oriented to person, place, and time. He appears well-developed and well-nourished.  HENT:  Head: Normocephalic and atraumatic.  Right Ear: External ear normal.  Left Ear: External ear normal.  Nose: Nose normal.  Mouth/Throat: Oropharynx is clear and moist.  Eyes: Pupils are equal, round, and reactive to light. Conjunctivae and EOM are normal.  Neck: Normal range of motion. Neck supple. No JVD present. No thyromegaly present.  Cardiovascular: Normal rate, regular rhythm and normal heart sounds.  Pulmonary/Chest: Effort normal and breath sounds normal.  Musculoskeletal: Normal range of motion.  Lymphadenopathy:    He has no cervical adenopathy.  Neurological: He is alert and oriented to person, place, and time. No sensory deficit. He exhibits normal muscle tone.  Skin: Skin is warm and dry. Capillary refill takes less than 2 seconds.  Psychiatric: He has a normal mood and affect. His behavior is normal.  Vitals reviewed.    ASSESSMENT & PLAN: Essential hypertension Uncontrolled blood pressure.  Noncompliant with medications.  Advised to start amlodipine 5 mg a day.  Will  change Zestoretic to 20/12.5.  Continue diet and exercise.  Follow-up in 3 to 6 months.  Coyt was seen today for hypertension.  Diagnoses and all orders for this visit:  Essential hypertension -     lisinopril-hydrochlorothiazide (ZESTORETIC) 20-12.5 MG tablet; Take 1 tablet by mouth daily. -     amLODipine (NORVASC) 5 MG tablet; Take 1 tablet (5 mg total) by mouth daily.  Sciatica of right side -     lidocaine (LIDODERM) 5 %; Place 1 patch onto the skin daily. Remove & Discard patch within 12 hours or as directed by MD  Need for influenza  vaccination -     Flu Vaccine QUAD 36+ mos IM    Patient Instructions       If you have lab work done today you will be contacted with your lab results within the next 2 weeks.  If you have not heard from [place] then please contact us. The fastest way to get your results is to register for My Chart.   IF you received an x-ray today, you will receive an invoice from [facility]. Please contact [facility] at [phone] with questions or concerns regarding your invoice.   IF you received labwork today, you will receive an invoice from [facility]. Please contact [facility] at [phone] with questions or concerns regarding your invoice.   Our billing staff will not be able to assist you with questions regarding bills from these companies.  You will be contacted with the lab results as soon as they are available. The fastest way to get your results is to activate your My Chart account. Instructions are located on the last page of this paperwork. If you have not heard from [place] regarding the results in 2 weeks, please contact this office.      Hypertension Hypertension, commonly called high blood pressure, is when the force of blood pumping through the arteries is too strong. The arteries are the blood vessels that carry blood from the heart throughout the body. Hypertension forces the heart to work harder to pump blood and may cause  arteries to become narrow or stiff. Having untreated or uncontrolled hypertension can cause heart attacks, strokes, kidney disease, and other problems. A blood pressure reading consists of a higher number over a lower number. Ideally, your blood pressure should be below 120/80. The first ("top") number is called the systolic pressure. It is a measure of the pressure in your arteries as your heart beats. The second ("bottom") number is called the diastolic pressure. It is a measure of the pressure in your arteries as the heart relaxes. What are the causes? The cause of this condition is not known. What increases the risk? Some risk factors for high blood pressure are under your control. Others are not. Factors you can change  Smoking.  Having type 2 diabetes mellitus, high cholesterol, or both.  Not getting enough exercise or physical activity.  Being overweight.  Having too much fat, sugar, calories, or salt (sodium) in your diet.  Drinking too much alcohol. Factors that are difficult or impossible to change  Having chronic kidney disease.  Having a family history of high blood pressure.  Age. Risk increases with age.  Race. You may be at higher risk if you are African-American.  Gender. Men are at higher risk than women before age 77. After age 59, women are at higher risk than men.  Having obstructive sleep apnea.  Stress. What are the signs or symptoms? Extremely high blood pressure (hypertensive crisis) may cause:  Headache.  Anxiety.  Shortness of breath.  Nosebleed.  Nausea and vomiting.  Severe chest pain.  Jerky movements you cannot control (seizures).  How is this diagnosed? This condition is diagnosed by measuring your blood pressure while you are seated, with your arm resting on a surface. The cuff of the blood pressure monitor will be placed directly against the skin of your upper arm at the level of your heart. It should be measured at least twice  using the same arm. Certain conditions can cause a difference in blood pressure between your right and left arms. Certain  factors can cause blood pressure readings to be lower or higher than normal (elevated) for a short period of time:  When your blood pressure is higher when you are in a health care provider's office than when you are at home, this is called white coat hypertension. Most people with this condition do not need medicines.  When your blood pressure is higher at home than when you are in a health care provider's office, this is called masked hypertension. Most people with this condition may need medicines to control blood pressure.  If you have a high blood pressure reading during one visit or you have normal blood pressure with other risk factors:  You may be asked to return on a different day to have your blood pressure checked again.  You may be asked to monitor your blood pressure at home for 1 week or longer.  If you are diagnosed with hypertension, you may have other blood or imaging tests to help your health care provider understand your overall risk for other conditions. How is this treated? This condition is treated by making healthy lifestyle changes, such as eating healthy foods, exercising more, and reducing your alcohol intake. Your health care provider may prescribe medicine if lifestyle changes are not enough to get your blood pressure under control, and if:  Your systolic blood pressure is above 130.  Your diastolic blood pressure is above 80.  Your personal target blood pressure may vary depending on your medical conditions, your age, and other factors. Follow these instructions at home: Eating and drinking  Eat a diet that is high in fiber and potassium, and low in sodium, added sugar, and fat. An example eating plan is called the DASH (Dietary Approaches to Stop Hypertension) diet. To eat this way: ? Eat plenty of fresh fruits and vegetables. Try to fill  half of your plate at each meal with fruits and vegetables. ? Eat whole grains, such as whole wheat pasta, brown rice, or whole grain bread. Fill about one quarter of your plate with whole grains. ? Eat or drink low-fat dairy products, such as skim milk or low-fat yogurt. ? Avoid fatty cuts of meat, processed or cured meats, and poultry with skin. Fill about one quarter of your plate with lean proteins, such as fish, chicken without skin, beans, eggs, and tofu. ? Avoid premade and processed foods. These tend to be higher in sodium, added sugar, and fat.  Reduce your daily sodium intake. Most people with hypertension should eat less than 1,500 mg of sodium a day.  Limit alcohol intake to no more than 1 drink a day for nonpregnant women and 2 drinks a day for men. One drink equals 12 oz of beer, 5 oz of wine, or 1 oz of hard liquor. Lifestyle  Work with your health care provider to maintain a healthy body weight or to lose weight. Ask what an ideal weight is for you.  Get at least 30 minutes of exercise that causes your heart to beat faster (aerobic exercise) most days of the week. Activities may include walking, swimming, or biking.  Include exercise to strengthen your muscles (resistance exercise), such as pilates or lifting weights, as part of your weekly exercise routine. Try to do these types of exercises for 30 minutes at least 3 days a week.  Do not use any products that contain nicotine or tobacco, such as cigarettes and e-cigarettes. If you need help quitting, ask your health care provider.  Monitor your blood pressure  at home as told by your health care provider.  Keep all follow-up visits as told by your health care provider. This is important. Medicines  Take over-the-counter and prescription medicines only as told by your health care provider. Follow directions carefully. Blood pressure medicines must be taken as prescribed.  Do not skip doses of blood pressure medicine. Doing  this puts you at risk for problems and can make the medicine less effective.  Ask your health care provider about side effects or reactions to medicines that you should watch for. Contact a health care provider if:  You think you are having a reaction to a medicine you are taking.  You have headaches that keep coming back (recurring).  You feel dizzy.  You have swelling in your ankles.  You have trouble with your vision. Get help right away if:  You develop a severe headache or confusion.  You have unusual weakness or numbness.  You feel faint.  You have severe pain in your chest or abdomen.  You vomit repeatedly.  You have trouble breathing. Summary  Hypertension is when the force of blood pumping through your arteries is too strong. If this condition is not controlled, it may put you at risk for serious complications.  Your personal target blood pressure may vary depending on your medical conditions, your age, and other factors. For most people, a normal blood pressure is less than 120/80.  Hypertension is treated with lifestyle changes, medicines, or a combination of both. Lifestyle changes include weight loss, eating a healthy, low-sodium diet, exercising more, and limiting alcohol. This information is not intended to replace advice given to you by your health care provider. Make sure you discuss any questions you have with your health care provider. Document Released: 03/27/2005 Document Revised: 02/23/2016 Document Reviewed: 02/23/2016 Elsevier Interactive Patient Education  2018 Elsevier Inc.      Agustina Caroli, MD [facility]

## 2018-03-15 ENCOUNTER — Ambulatory Visit: Payer: Self-pay

## 2018-03-15 NOTE — Telephone Encounter (Signed)
Pt. Calling to report he is still having elevated BP readings and would like an appointment. No symptoms.Would an appointment after 4 p.m. If possible. No availability. Please advise pt. Contact number [phone]. Interpreter number [phone].  Reason for Disposition . Systolic BP  >= 710 OR Diastolic >= 626  Answer Assessment - Initial Assessment Questions 1. BLOOD PRESSURE: "What is the blood pressure?" "Did you take at least two measurements 5 minutes apart?"     142/96 2. ONSET: "When did you take your blood pressure?"     This morning 3. HOW: "How did you obtain the blood pressure?" (e.g., visiting nurse, automatic home BP monitor)      At work 4. HISTORY: "Do you have a history of high blood pressure?"     Yes 5. MEDICATIONS: "Are you taking any medications for blood pressure?" "Have you missed any doses recently?"     No missed doses 6. OTHER SYMPTOMS: "Do you have any symptoms?" (e.g., headache, chest pain, blurred vision, difficulty breathing, weakness)     No 7. PREGNANCY: "Is there any chance you are pregnant?" "When was your last menstrual period?"     n/a  Protocols used: HIGH BLOOD PRESSURE-A-AH

## 2018-03-18 NOTE — Telephone Encounter (Signed)
Please advise 

## 2018-03-18 NOTE — Telephone Encounter (Signed)
The doctor has to give [place] the ok to over book we cant just do it---please ask the patients PCP if they would like [place] to do this?

## 2018-03-21 ENCOUNTER — Telehealth: Payer: Self-pay | Admitting: Emergency Medicine

## 2018-03-21 NOTE — Telephone Encounter (Signed)
Called pt using interpreter services in regards to him needing to make an appt. I advised of the first available and pt stated that he would not be here at that time. I advised that he could call in on the 18th for an appt on the 19th or seek an Urgent Care if he felt it was needed. Pt acknowledged.

## 2018-03-28 ENCOUNTER — Ambulatory Visit (INDEPENDENT_AMBULATORY_CARE_PROVIDER_SITE_OTHER): Payer: No Typology Code available for payment source | Admitting: Family Medicine

## 2018-03-28 ENCOUNTER — Other Ambulatory Visit: Payer: Self-pay

## 2018-03-28 ENCOUNTER — Encounter: Payer: Self-pay | Admitting: Family Medicine

## 2018-03-28 VITALS — BP 146/85 | HR 68 | Temp 98.8°F | Resp 18 | Ht 67.0 in | Wt 203.8 lb

## 2018-03-28 DIAGNOSIS — D239 Other benign neoplasm of skin, unspecified: Secondary | ICD-10-CM

## 2018-03-28 DIAGNOSIS — L603 Nail dystrophy: Secondary | ICD-10-CM | POA: Diagnosis not present

## 2018-03-28 DIAGNOSIS — R0789 Other chest pain: Secondary | ICD-10-CM

## 2018-03-28 DIAGNOSIS — I10 Essential (primary) hypertension: Secondary | ICD-10-CM | POA: Diagnosis not present

## 2018-03-28 MED ORDER — AMLODIPINE BESYLATE 10 MG PO TABS: 10.0000 mg | ORAL_TABLET | Freq: Every day | ORAL | 3 refills | Status: DC

## 2018-03-28 MED ORDER — LISINOPRIL-HYDROCHLOROTHIAZIDE 20-12.5 MG PO TABS: 1.0000 | ORAL_TABLET | Freq: Every day | ORAL | 3 refills | Status: DC

## 2018-03-28 MED FILL — AMLODIPINE BESYLATE 10 MG T: 10 | 90 days supply | Qty: 90 | Fill #0

## 2018-03-28 MED FILL — LISINOPRIL-HCTZ 20-12.5 MG: 20-12.5 | 90 days supply | Qty: 90 | Fill #0

## 2018-03-28 NOTE — Progress Notes (Signed)
Patient ID: Aaron Bowman, male    DOB: Sep 08, 1955  Age: 62 y.o. MRN: [id number]  Chief Complaint  Patient presents with  . Hypertension    Subjective:   Patient is here for a couple of things.  He has a history of high blood pressure and takes 2 medications for that.  He checks his blood pressure at home, and it continues to run a little bit high.  He has no other major symptoms.  He does not get short of breath.  He does get some chest wall pain when he is doing upper body exercise.  It sounds like musculoskeletal pain in the low right chest primarily.  He has a couple of dermatologic questions.  He has a couple of lesions on his right upper arm that have been there for some time.  Also has a pigmented area on the lateral aspect of the left great toe.  That is been that way for a long time.  Otherwise he is generally very healthy.  Works as a Glass blower/designer.  Current allergies, medications, problem list, past/family and social histories reviewed.  Objective:  BP (!) 146/85   Pulse 68   Temp 98.8 F (37.1 C) (Oral)   Resp 18   Ht 5\' 7"  (1.702 m)   Wt 203 lb 12.8 oz (92.4 kg)   SpO2 99%   BMI 31.92 kg/m   Major acute distress.  Neck supple without nodes or tenderness.  Chest clear to auscultation.  Heart regular without murmur.  Chest wall not tender at this time.  He has 2 lesions on the right upper arm, one laterally and one more anteriorly, that are about 1 cm pigmented slightly erythematous border uniform round lesions.  These have been there for a good time.  Nontender.  Appear to be dermatofibromas.  Is left great toe has a little pigmented streak on the medial aspect of it in the mildly dystrophic nail.  Assessment & Plan:   Assessment: 1. Right-sided chest wall pain   2. HTN (hypertension), benign   3. Dermatofibroma   4. Dystrophic nail   5. Essential hypertension       Plan: The skin lesions and toenail do not appear pathologic to me at this time, and since they  have been there for a long time feel like they are not of any major concern.  The chest discomfort sounds like a chest wall pain, but I will go ahead and check an EKG because he has longstanding hypertension.  We will need to increase his blood pressure medication.  He was checked for diabetes last year and it is good.  Orders Placed This Encounter  Procedures  . EKG 12-Lead  EKG is normal.  Reassured the patient.  Meds ordered this encounter  Medications  . amLODipine (NORVASC) 10 MG tablet    Sig: Take 1 tablet (10 mg total) by mouth daily.    Dispense:  90 tablet    Refill:  3  . lisinopril-hydrochlorothiazide (ZESTORETIC) 20-12.5 MG tablet    Sig: Take 1 tablet by mouth daily.    Dispense:  90 tablet    Refill:  3         Patient Instructions     Take a picture on your phone of each of those skin lesions and have that so you can compare it in 6 months or a year and make sure neither is changing.  Continue your blood pressure medications.  Are I am increasing the dose of  amlodipine to 10 mg daily.  Continue on the lisinopril hydrochlorothiazide same dose.  Plan to return in about 3 or 4 months to let Dr. Mitchel Honour recheck you.  If you have lab work done today you will be contacted with your lab results within the next 2 weeks.  If you have not heard from [place] then please contact us. The fastest way to get your results is to register for My Chart.   IF you received an x-ray today, you will receive an invoice from [facility]. Please contact [facility] at [phone] with questions or concerns regarding your invoice.   IF you received labwork today, you will receive an invoice from [facility]. Please contact [facility] at [phone] with questions or concerns regarding your invoice.   Our billing staff will not be able to assist you with questions regarding bills from these companies.  You will be contacted with the lab results as soon as they are  available. The fastest way to get your results is to activate your My Chart account. Instructions are located on the last page of this paperwork. If you have not heard from [place] regarding the results in 2 weeks, please contact this office.        Return in about 3 months (around 06/27/2018), or [place], for blood pressure recheck.   Ruben Reason, MD 03/28/2018

## 2018-03-28 NOTE — Patient Instructions (Addendum)
   Take a picture on your phone of each of those skin lesions and have that so you can compare it in 6 months or a year and make sure neither is changing.  Continue your blood pressure medications.  Are I am increasing the dose of amlodipine to 10 mg daily.  Continue on the lisinopril hydrochlorothiazide same dose.  Plan to return in about 3 or 4 months to let Dr. Mitchel Honour recheck you.  If you have lab work done today you will be contacted with your lab results within the next 2 weeks.  If you have not heard from [place] then please contact us. The fastest way to get your results is to register for My Chart.   IF you received an x-ray today, you will receive an invoice from [facility]. Please contact [facility] at [phone] with questions or concerns regarding your invoice.   IF you received labwork today, you will receive an invoice from [facility]. Please contact [facility] at [phone] with questions or concerns regarding your invoice.   Our billing staff will not be able to assist you with questions regarding bills from these companies.  You will be contacted with the lab results as soon as they are available. The fastest way to get your results is to activate your My Chart account. Instructions are located on the last page of this paperwork. If you have not heard from [place] regarding the results in 2 weeks, please contact this office.

## 2018-04-16 ENCOUNTER — Ambulatory Visit: Payer: Self-pay | Admitting: *Deleted

## 2018-04-16 NOTE — Telephone Encounter (Signed)
Interpreter on line Aaron Bowman, ID# [id number]  Pt reports increased edema both legs. Onset 1 week after starting increased dose of amlodipine.Started 10 mg 03/28/18.   Pt states "I think it's from the medication." Reports edema is from feet to knees. States "Mild" but reports "Does leave a dent for 2 seconds." States is able to wear shoes. Denies any SOB, pain. No redness of legs. States legs feel warm "At times when I'm on them at work." Pt's BP this am 142/86. States BP has generally lowered "Some." Unable to secure appt per protocol. Directed pt to UC if symptoms worsen, any SOB, legs develop redness, warmth, pain. Pt advised [place] would alert Dr. Franky Macho. Requesting advise regarding medication (amlodipine)  Please advise: [phone] Reason for Disposition . [1] MILD swelling of both ankles (i.e., pedal edema) AND [2] new onset or worsening  Answer Assessment - Initial Assessment Questions 1. ONSET: "When did the swelling start?" (e.g., minutes, hours, days)     1 week after increased Norvasc dose 2. LOCATION: "What part of the leg is swollen?"  "Are both legs swollen or just one leg?"    Ankle to knee 3. SEVERITY: "How bad is the swelling?" (e.g., localized; mild, moderate, severe)  - Localized - small area of swelling localized to one leg  - MILD pedal edema - swelling limited to foot and ankle, pitting edema < 1/4 inch (6 mm) deep, rest and elevation eliminate most or all swelling  - MODERATE edema - swelling of lower leg to knee, pitting edema > 1/4 inch (6 mm) deep, rest and elevation only partially reduce swelling  - SEVERE edema - swelling extends above knee, facial or hand swelling present      Mild, pitting slightly 2 seconds 4. REDNESS: "Does the swelling look red or infected?"     no 5. PAIN: "Is the swelling painful to touch?" If so, ask: "How painful is it?"   (Scale 1-10; mild, moderate or severe)     no 6. FEVER: "Do you have a fever?" If so, ask: "What is it, how was it measured,  and when did it start?"      no 7. CAUSE: "What do you think is causing the leg swelling?"     Norvasc 8. MEDICAL HISTORY: "Do you have a history of heart failure, kidney disease, liver failure, or cancer?"      9. RECURRENT SYMPTOM: "Have you had leg swelling before?" If so, ask: "When was the last time?" "What happened that time?"     no 10. OTHER SYMPTOMS: "Do you have any other symptoms?" (e.g., chest pain, difficulty breathing)       No, legs "Feel heavy."  Protocols used: LEG SWELLING AND EDEMA-A-AH

## 2018-06-27 ENCOUNTER — Other Ambulatory Visit: Payer: Self-pay

## 2018-06-27 ENCOUNTER — Ambulatory Visit (INDEPENDENT_AMBULATORY_CARE_PROVIDER_SITE_OTHER): Payer: No Typology Code available for payment source | Admitting: Emergency Medicine

## 2018-06-27 ENCOUNTER — Encounter: Payer: Self-pay | Admitting: Emergency Medicine

## 2018-06-27 VITALS — BP 130/80 | HR 77 | Temp 98.7°F | Resp 15 | Ht 67.0 in | Wt 198.4 lb

## 2018-06-27 DIAGNOSIS — Z23 Encounter for immunization: Secondary | ICD-10-CM

## 2018-06-27 DIAGNOSIS — I10 Essential (primary) hypertension: Secondary | ICD-10-CM | POA: Diagnosis not present

## 2018-06-27 DIAGNOSIS — R1011 Right upper quadrant pain: Secondary | ICD-10-CM | POA: Diagnosis not present

## 2018-06-27 DIAGNOSIS — R600 Localized edema: Secondary | ICD-10-CM | POA: Diagnosis not present

## 2018-06-27 DIAGNOSIS — G8929 Other chronic pain: Secondary | ICD-10-CM | POA: Insufficient documentation

## 2018-06-27 LAB — POCT URINALYSIS DIP (MANUAL ENTRY)
Bilirubin, UA: NEGATIVE
Blood, UA: NEGATIVE
Glucose, UA: NEGATIVE mg/dL
Ketones, POC UA: NEGATIVE mg/dL
Leukocytes, UA: NEGATIVE
NITRITE UA: NEGATIVE
PH UA: 6 (ref 5.0–8.0)
Protein Ur, POC: NEGATIVE mg/dL
Spec Grav, UA: 1.01 (ref 1.010–1.025)
UROBILINOGEN UA: 0.2 U/dL

## 2018-06-27 NOTE — Patient Instructions (Addendum)
     If you have lab work done today you will be contacted with your lab results within the next 2 weeks.  If you have not heard from [place] then please contact us. The fastest way to get your results is to register for My Chart.   IF you received an x-ray today, you will receive an invoice from [facility]. Please contact [facility] at [phone] with questions or concerns regarding your invoice.   IF you received labwork today, you will receive an invoice from [facility]. Please contact [facility] at [phone] with questions or concerns regarding your invoice.   Our billing staff will not be able to assist you with questions regarding bills from these companies.  You will be contacted with the lab results as soon as they are available. The fastest way to get your results is to activate your My Chart account. Instructions are located on the last page of this paperwork. If you have not heard from [place] regarding the results in 2 weeks, please contact this office.    After the blood work. You can go Dolor abdominal en los adultos Abdominal Pain, Adult  El dolor de [place] (abdominal) puede tener muchas causas. [place] veces, el dolor de [place] no es peligroso. Muchos de [employer] de dolor de estmago pueden controlarse y tratarse en casa. Sin embargo, a Clinical cytogeneticist, Conservation officer, historic buildings de [employer] puede ser grave. El mdico intentar descubrir la causa del dolor de [place]. Siga estas indicaciones en su casa:  Tome los medicamentos de venta libre y los recetados solamente como se lo haya indicado el mdico. No tome medicamentos que lo ayuden a Landscape architect (laxantes), salvo que el mdico se lo indique.  Beba suficiente lquido para mantener el pis (orina) claro o de color amarillo plido.  Est atento al dolor de estmago para Actuary cambio.  Concurra a todas las visitas de control como se lo haya indicado el mdico. Esto es importante. Comunquese con un mdico  si:  El dolor de estmago cambia o [place].  No tiene apetito o baja de peso sin proponrselo.  Tiene dificultades para defecar (est estreido) o heces lquidas (diarrea) durante ms de 2 o 3das.  Siente dolor al orinar o defecar.  El dolor de estmago lo despierta de noche.  El dolor empeora con las comidas, despus de comer o con determinados alimentos.  Vomita y no puede retener nada de lo que ingiere.  Tiene fiebre. Solicite ayuda de inmediato si:  El dolor no desaparece en el tiempo indicado por el mdico.  No puede detener los vmitos.  Siente dolor solamente en zonas especficas del abdomen, como el lado derecho o la parte inferior izquierda.  Tiene heces con sangre, de color negro o con aspecto alquitranado.  Tiene dolor muy intenso en el vientre, clicos o meteorismo.  Presenta signos de no tener suficientes lquidos o agua en el cuerpo (deshidratacin), por ejemplo: ? [place], es muy escasa o no orina. ? Labios agrietados. ? [employer]. ? Ojos hundidos. ? Somnolencia. ? Debilidad. Esta informacin no tiene Marine scientist el consejo del mdico. Asegrese de hacerle al mdico cualquier pregunta que tenga. Document Released: 06/23/2008 Document Revised: 06/21/2016 Document Reviewed: 09/08/2015 Elsevier Interactive Patient Education  2019 [employer].

## 2018-06-27 NOTE — Progress Notes (Signed)
BP Readings from Last 3 Encounters:  03/28/18 (!) 146/85  01/23/18 (!) 162/93  06/04/17 126/86   Aaron Bowman 63 y.o.   Chief Complaint  Patient presents with  . Hypertension    follow up blood pressure and feet swelling x 2 months  . Abdominal Pain    RIGHT side in abdominal x 1 month    HISTORY OF PRESENT ILLNESS: This is a 63 y.o. male with history of hypertension here for follow-up.  Noticed feet swelling 2 months ago, better now. Also complaining of right upper quadrant pain for 1 month.  Intermittent episodes lasting 1 to 2 minutes, not related to food ingestion, not associated with any other symptoms.  Gets 2-3 episodes per day.  Denies fever chills.  Denies diarrhea.  Denies melena or bleeding.  Denies nausea or vomiting.  Able to eat and drink well.  HPI   Prior to Admission medications   Medication Sig Start Date End Date Taking? Authorizing Provider  amLODipine (NORVASC) 10 MG tablet Take 1 tablet (10 mg total) by mouth daily. 03/28/18  Yes Posey Boyer, MD  aspirin EC 81 MG tablet Take 81 mg by mouth daily.   Yes [provider]  cholecalciferol (VITAMIN D) 400 units TABS tablet Take 400 Units by mouth daily.   Yes [provider]  lidocaine (LIDODERM) 5 % Place 1 patch onto the skin daily. Remove & Discard patch within 12 hours or as directed by MD 01/23/18  Yes Jeidi Gilles, Ines Bloomer, MD  lisinopril-hydrochlorothiazide (ZESTORETIC) 20-12.5 MG tablet Take 1 tablet by mouth daily. 03/28/18  Yes Posey Boyer, MD  Omega-3 Fatty Acids (FISH OIL) 1000 MG CAPS Take 1,000 mg by mouth.   Yes [provider]  vitamin B-12 (CYANOCOBALAMIN) 100 MCG tablet Take 100 mcg by mouth daily.   Yes [provider]  vitamin E 1000 UNIT capsule Take 1,000 Units by mouth daily.   Yes [provider]  cyclobenzaprine (FLEXERIL) 5 MG tablet Take 1 tablet (5 mg total) by mouth 3 (three) times daily as needed. Patient not taking: Reported on  06/27/2018 01/06/16   Jaynee Eagles, PA-C  ferrous sulfate 325 (65 FE) MG EC tablet Take 325 mg by mouth 1 day or 1 dose.    [provider]  meloxicam (MOBIC) 7.5 MG tablet Take 1 tablet (7.5 mg total) by mouth daily. Patient not taking: Reported on 06/27/2018 08/09/16   Horald Pollen, MD  ondansetron (ZOFRAN ODT) 4 MG disintegrating tablet Take 1 tablet (4 mg total) by mouth every 8 (eight) hours as needed for nausea or vomiting. Patient not taking: Reported on 06/27/2018 06/04/17   Ivar Drape D, PA    No Known Allergies  Patient Active Problem List   Diagnosis Date Noted  . Essential hypertension 06/18/2007    Past Medical History:  Diagnosis Date  . Arthritis   . GERD (gastroesophageal reflux disease)   . Hypertension   . Tuberculosis     No past surgical history on file.  Social History   Socioeconomic History  . Marital status: Married    Spouse name: Not on file  . Number of children: Not on file  . Years of education: Not on file  . Highest education level: Not on file  Occupational History  . Occupation: Glass blower/designer  Social Needs  . Financial resource strain: Not on file  . Food insecurity:    Worry: Not on file    Inability: Not on file  .  Transportation needs:    Medical: Not on file    Non-medical: Not on file  Tobacco Use  . Smoking status: Never Smoker  . Smokeless tobacco: Never Used  Substance and Sexual Activity  . Alcohol use: Yes    Alcohol/week: 5.0 standard drinks    Types: 5 Cans of beer per week  . Drug use: No  . Sexual activity: Not on file  Lifestyle  . Physical activity:    Days per week: Not on file    Minutes per session: Not on file  . Stress: Not on file  Relationships  . Social connections:    Talks on phone: Not on file    Gets together: Not on file    Attends religious service: Not on file    Active member of club or organization: Not on file    Attends meetings of clubs or organizations: Not on file     Relationship status: Not on file  . Intimate partner violence:    Fear of current or ex partner: Not on file    Emotionally abused: Not on file    Physically abused: Not on file    Forced sexual activity: Not on file  Other Topics Concern  . Not on file  Social History Narrative  . Not on file    Family History  Problem Relation Age of Onset  . Hypertension Mother   . Cancer Sister   . Cancer Brother   . Cancer Brother      Review of Systems  Constitutional: Negative.  Negative for chills and fever.  HENT: Negative.   Eyes: Negative.   Respiratory: Negative for cough and shortness of breath.   Cardiovascular: Positive for leg swelling. Negative for chest pain.  Gastrointestinal: Positive for abdominal pain. Negative for blood in stool, diarrhea, melena, nausea and vomiting.  Genitourinary: Negative.  Negative for dysuria and hematuria.  Skin: Negative.  Negative for rash.  Neurological: Negative.  Negative for dizziness and headaches.  Endo/Heme/Allergies: Negative.   All other systems reviewed and are negative.   Vitals:   06/27/18 1555  Pulse: 77  Resp: 15  Temp: 98.7 F (37.1 C)  SpO2: 98%    Physical Exam Vitals signs reviewed.  Constitutional:      Appearance: Normal appearance. He is well-developed.  HENT:     Head: Normocephalic and atraumatic.     Mouth/Throat:     Mouth: Mucous membranes are moist.     Pharynx: Oropharynx is clear.  Eyes:     Extraocular Movements: Extraocular movements intact.     Conjunctiva/sclera: Conjunctivae normal.     Pupils: Pupils are equal, round, and reactive to light.  Neck:     Musculoskeletal: Normal range of motion and neck supple.  Cardiovascular:     Rate and Rhythm: Normal rate and regular rhythm.     Heart sounds: Normal heart sounds.  Pulmonary:     Effort: Pulmonary effort is normal.     Breath sounds: Normal breath sounds.  Abdominal:     General: Bowel sounds are normal. There is no distension.      Palpations: Abdomen is soft. There is no mass.     Tenderness: There is no abdominal tenderness. There is no guarding or rebound.     Hernia: No hernia is present.  Musculoskeletal: Normal range of motion.     Right lower leg: Edema (mild) present.     Left lower leg: Edema (mild) present.  Skin:  General: Skin is warm and dry.     Capillary Refill: Capillary refill takes less than 2 seconds.  Neurological:     General: No focal deficit present.     Mental Status: He is alert and oriented to person, place, and time.  Psychiatric:        Mood and Affect: Mood normal.        Behavior: Behavior normal.    Results for orders placed or performed in visit on 06/27/18 (from the past 24 hour(s))  POCT urinalysis dipstick     Status: Normal   Collection Time: 06/27/18  4:49 PM  Result Value Ref Range   Color, UA yellow yellow   Clarity, UA clear clear   Glucose, UA negative negative mg/dL   Bilirubin, UA negative negative   Ketones, POC UA negative negative mg/dL   Spec Grav, UA 1.010 1.010 - 1.025   Blood, UA negative negative   pH, UA 6.0 5.0 - 8.0   Protein Ur, POC negative negative mg/dL   Urobilinogen, UA 0.2 0.2 or 1.0 E.U./dL   Nitrite, UA Negative Negative   Leukocytes, UA Negative Negative       A total of 25 minutes was spent in the room with the patient, greater than 50% of which was in counseling/coordination of care regarding differential diagnosis, need for diagnostic work-up, treatment, medications, prognosis, and need for follow-up.   ASSESSMENT & PLAN: Fady was seen today for hypertension and abdominal pain.  Diagnoses and all orders for this visit:  Right upper quadrant pain Comments: Rule out cholelithiasis Orders: -     POCT urinalysis dipstick -     Comprehensive metabolic panel -     CBC with Differential/Platelet -     US Abdomen Limited RUQ; Future -     Lipase  Pedal edema Comments: Most likely related to amlodipine use  Need for Tdap  vaccination -     Tdap vaccine greater than or equal to 7yo IM  HTN (hypertension), benign    Patient Instructions       If you have lab work done today you will be contacted with your lab results within the next 2 weeks.  If you have not heard from [place] then please contact us. The fastest way to get your results is to register for My Chart.   IF you received an x-ray today, you will receive an invoice from [facility]. Please contact [facility] at [phone] with questions or concerns regarding your invoice.   IF you received labwork today, you will receive an invoice from [place]. Please contact [facility] at [phone] with questions or concerns regarding your invoice.   Our billing staff will not be able to assist you with questions regarding bills from these companies.  You will be contacted with the lab results as soon as they are available. The fastest way to get your results is to activate your My Chart account. Instructions are located on the last page of this paperwork. If you have not heard from [place] regarding the results in 2 weeks, please contact this office.    After the blood work. You can go Dolor abdominal en los adultos Abdominal Pain, Adult  El dolor de [place] (abdominal) puede tener muchas causas. [place] veces, el dolor de [place] no es peligroso. Muchos de [employer] de dolor de estmago pueden controlarse y tratarse en casa. Sin embargo, a Clinical cytogeneticist, Conservation officer, historic buildings de [employer] puede ser grave. El mdico intentar descubrir  la causa del dolor de [place]. Siga estas indicaciones en su casa:  Tome los medicamentos de venta libre y los recetados solamente como se lo haya indicado el mdico. No tome medicamentos que lo ayuden a Landscape architect (laxantes), salvo que el mdico se lo indique.  Beba suficiente lquido para mantener el pis (orina) claro o de color amarillo plido.  Est atento al dolor de estmago para [place] cambio.   Concurra a todas las visitas de control como se lo haya indicado el mdico. Esto es importante. Comunquese con un mdico si:  El dolor de estmago cambia o [place].  No tiene apetito o baja de peso sin proponrselo.  Tiene dificultades para defecar (est estreido) o heces lquidas (diarrea) durante ms de 2 o 3das.  Siente dolor al orinar o defecar.  El dolor de estmago lo despierta de noche.  El dolor empeora con las comidas, despus de comer o con determinados alimentos.  Vomita y no puede retener nada de lo que ingiere.  Tiene fiebre. Solicite ayuda de inmediato si:  El dolor no desaparece en el tiempo indicado por el mdico.  No puede detener los vmitos.  Siente dolor solamente en zonas especficas del abdomen, como el lado derecho o la parte inferior izquierda.  Tiene heces con sangre, de color negro o con aspecto alquitranado.  Tiene dolor muy intenso en el vientre, clicos o meteorismo.  Presenta signos de no tener suficientes lquidos o agua en el cuerpo (deshidratacin), por ejemplo: ? [place] es [place], es muy escasa o no orina. ? Labios agrietados. ? [employer]. ? Ojos hundidos. ? Somnolencia. ? Debilidad. Esta informacin no tiene Marine scientist el consejo del mdico. Asegrese de hacerle al mdico cualquier pregunta que tenga. Document Released: 06/23/2008 Document Revised: 06/21/2016 Document Reviewed: 09/08/2015 Elsevier Interactive Patient Education  2019 [employer]      Agustina Caroli, MD [facility]

## 2018-06-28 ENCOUNTER — Encounter: Payer: Self-pay | Admitting: Radiology

## 2018-06-28 LAB — COMPREHENSIVE METABOLIC PANEL
A/G RATIO: 1.2 (ref 1.2–2.2)
ALT: 23 IU/L (ref 0–44)
AST: 26 IU/L (ref 0–40)
Albumin: 4.5 g/dL (ref 3.8–4.8)
Alkaline Phosphatase: 60 IU/L (ref 39–117)
BILIRUBIN TOTAL: 0.3 mg/dL (ref 0.0–1.2)
BUN/Creatinine Ratio: 13 (ref 10–24)
BUN: 14 mg/dL (ref 8–27)
CALCIUM: 9 mg/dL (ref 8.6–10.2)
CHLORIDE: 100 mmol/L (ref 96–106)
CO2: 19 mmol/L — ABNORMAL LOW (ref 20–29)
Creatinine, Ser: 1.04 mg/dL (ref 0.76–1.27)
GFR calc Af Amer: 89 mL/min/{1.73_m2} (ref >59)
GFR calc non Af Amer: 77 mL/min/{1.73_m2} (ref >59)
Globulin, Total: 3.8 g/dL (ref 1.5–4.5)
Glucose: 92 mg/dL (ref 65–99)
POTASSIUM: 3.7 mmol/L (ref 3.5–5.2)
SODIUM: 137 mmol/L (ref 134–144)
Total Protein: 8.3 g/dL (ref 6.0–8.5)

## 2018-06-28 LAB — CBC WITH DIFFERENTIAL/PLATELET
BASOS ABS: 0 10*3/uL (ref 0.0–0.2)
Basos: 1 %
EOS (ABSOLUTE): 0.2 10*3/uL (ref 0.0–0.4)
Eos: 4 %
Hematocrit: 41.3 % (ref 37.5–51.0)
Hemoglobin: 14.6 g/dL (ref 13.0–17.7)
Immature Grans (Abs): 0 10*3/uL (ref 0.0–0.1)
Immature Granulocytes: 0 %
Lymphocytes Absolute: 3.2 10*3/uL — ABNORMAL HIGH (ref 0.7–3.1)
Lymphs: 52 %
MCH: 32.2 pg (ref 26.6–33.0)
MCHC: 35.4 g/dL (ref 31.5–35.7)
MCV: 91 fL (ref 79–97)
MONOS ABS: 0.5 10*3/uL (ref 0.1–0.9)
Monocytes: 9 %
Neutrophils Absolute: 2.1 10*3/uL (ref 1.4–7.0)
Neutrophils: 34 %
Platelets: 277 10*3/uL (ref 150–450)
RBC: 4.53 x10E6/uL (ref 4.14–5.80)
RDW: 12.9 % (ref 11.6–15.4)
WBC: 6 10*3/uL (ref 3.4–10.8)

## 2018-06-28 LAB — LIPASE: Lipase: 28 U/L (ref 13–78)

## 2018-07-02 MED FILL — LISINOPRIL-HCTZ 20-12.5 MG: 20-12.5 | 90 days supply | Qty: 90 | Fill #1

## 2018-07-02 MED FILL — AMLODIPINE BESYLATE 10 MG T: 10 | 90 days supply | Qty: 90 | Fill #1

## 2018-07-15 ENCOUNTER — Ambulatory Visit: Payer: No Typology Code available for payment source | Admitting: Emergency Medicine

## 2018-08-29 ENCOUNTER — Ambulatory Visit (INDEPENDENT_AMBULATORY_CARE_PROVIDER_SITE_OTHER): Payer: No Typology Code available for payment source

## 2018-08-29 ENCOUNTER — Other Ambulatory Visit: Payer: Self-pay

## 2018-08-29 DIAGNOSIS — R1011 Right upper quadrant pain: Secondary | ICD-10-CM | POA: Diagnosis not present

## 2018-09-02 ENCOUNTER — Encounter: Payer: Self-pay | Admitting: Emergency Medicine

## 2018-09-05 ENCOUNTER — Encounter: Payer: No Typology Code available for payment source | Admitting: Emergency Medicine

## 2018-09-10 ENCOUNTER — Encounter: Payer: Self-pay | Admitting: Emergency Medicine

## 2018-09-10 ENCOUNTER — Other Ambulatory Visit: Payer: Self-pay

## 2018-09-10 ENCOUNTER — Ambulatory Visit (INDEPENDENT_AMBULATORY_CARE_PROVIDER_SITE_OTHER): Payer: No Typology Code available for payment source | Admitting: Emergency Medicine

## 2018-09-10 VITALS — BP 110/62 | HR 98 | Temp 99.2°F | Resp 18 | Ht 66.0 in | Wt 200.0 lb

## 2018-09-10 DIAGNOSIS — I10 Essential (primary) hypertension: Secondary | ICD-10-CM | POA: Diagnosis not present

## 2018-09-10 DIAGNOSIS — M5431 Sciatica, right side: Secondary | ICD-10-CM

## 2018-09-10 DIAGNOSIS — Z0001 Encounter for general adult medical examination with abnormal findings: Secondary | ICD-10-CM | POA: Diagnosis not present

## 2018-09-10 DIAGNOSIS — Z0000 Encounter for general adult medical examination without abnormal findings: Secondary | ICD-10-CM

## 2018-09-10 MED ORDER — LISINOPRIL-HYDROCHLOROTHIAZIDE 20-12.5 MG PO TABS: 1.0000 | ORAL_TABLET | Freq: Every day | ORAL | 3 refills | Status: DC

## 2018-09-10 MED ORDER — LIDOCAINE 5 % EX PTCH: 1.0000 | MEDICATED_PATCH | CUTANEOUS | 0 refills | Status: DC

## 2018-09-10 MED ORDER — AMLODIPINE BESYLATE 10 MG PO TABS: 10.0000 mg | ORAL_TABLET | Freq: Every day | ORAL | 3 refills | Status: DC

## 2018-09-10 MED FILL — AMLODIPINE BESYLATE 10 MG T: 10 | 90 days supply | Qty: 90 | Fill #0

## 2018-09-10 MED FILL — LISINOPRIL-HCTZ 20-12.5 MG: 20-12.5 | 90 days supply | Qty: 90 | Fill #0

## 2018-09-10 MED FILL — LIDOCAINE PATCH 5%: 5 | 30 days supply | Qty: 30 | Fill #0

## 2018-09-10 NOTE — Progress Notes (Signed)
BP Readings from Last 3 Encounters:  06/27/18 130/80  03/28/18 (!) 146/85  01/23/18 (!) 162/93   Aaron Bowman 63 y.o.   Chief Complaint  Patient presents with  . Annual Exam  . Medication Refill    Lisinopril-Hctz and Amlodipine    HISTORY OF PRESENT ILLNESS: This is a 63 y.o. male here for annual exam.  Has no complaints or medical concerns today. Has a history of hypertension on amlodipine and lisinopril-HCTZ, needs medication refills.  HPI   Prior to Admission medications   Medication Sig Start Date End Date Taking? Authorizing Provider  amLODipine (NORVASC) 10 MG tablet Take 1 tablet (10 mg total) by mouth daily. 09/10/18  Yes Horald Pollen, MD  aspirin EC 81 MG tablet Take 81 mg by mouth daily.   Yes [provider]  cholecalciferol (VITAMIN D) 400 units TABS tablet Take 400 Units by mouth daily.   Yes [provider]  lisinopril-hydrochlorothiazide (ZESTORETIC) 20-12.5 MG tablet Take 1 tablet by mouth daily. 09/10/18  Yes Mairead Schwarzkopf, Ines Bloomer, MD  Omega-3 Fatty Acids (FISH OIL) 1000 MG CAPS Take 1,000 mg by mouth.   Yes [provider]  vitamin B-12 (CYANOCOBALAMIN) 100 MCG tablet Take 100 mcg by mouth daily.   Yes [provider]  vitamin E 1000 UNIT capsule Take 1,000 Units by mouth daily.   Yes [provider]  cyclobenzaprine (FLEXERIL) 5 MG tablet Take 1 tablet (5 mg total) by mouth 3 (three) times daily as needed. Patient not taking: Reported on 09/10/2018 01/06/16   Jaynee Eagles, PA-C  ferrous sulfate 325 (65 FE) MG EC tablet Take 325 mg by mouth 1 day or 1 dose.    [provider]  lidocaine (LIDODERM) 5 % Place 1 patch onto the skin daily. Remove & Discard patch within 12 hours or as directed by MD 09/10/18   Horald Pollen, MD  meloxicam (MOBIC) 7.5 MG tablet Take 1 tablet (7.5 mg total) by mouth daily. Patient not taking: Reported on 09/10/2018 08/09/16   Horald Pollen, MD  ondansetron (ZOFRAN ODT)  4 MG disintegrating tablet Take 1 tablet (4 mg total) by mouth every 8 (eight) hours as needed for nausea or vomiting. Patient not taking: Reported on 09/10/2018 06/04/17   Joretta Bachelor, PA    Not on File  Patient Active Problem List   Diagnosis Date Noted  . HTN (hypertension), benign 06/27/2018  . Essential hypertension 06/18/2007    Past Medical History:  Diagnosis Date  . Arthritis   . GERD (gastroesophageal reflux disease)   . Hypertension   . Tuberculosis     History reviewed. No pertinent surgical history.  Social History   Socioeconomic History  . Marital status: Married    Spouse name: Not on file  . Number of children: Not on file  . Years of education: Not on file  . Highest education level: Not on file  Occupational History  . Occupation: Glass blower/designer  Social Needs  . Financial resource strain: Not on file  . Food insecurity:    Worry: Not on file    Inability: Not on file  . Transportation needs:    Medical: Not on file    Non-medical: Not on file  Tobacco Use  . Smoking status: Never Smoker  . Smokeless tobacco: Never Used  Substance and Sexual Activity  . Alcohol use: Yes    Alcohol/week: 5.0 standard drinks    Types: 5 Cans of beer per week  .  Drug use: No  . Sexual activity: Not on file  Lifestyle  . Physical activity:    Days per week: Not on file    Minutes per session: Not on file  . Stress: Not on file  Relationships  . Social connections:    Talks on phone: Not on file    Gets together: Not on file    Attends religious service: Not on file    Active member of club or organization: Not on file    Attends meetings of clubs or organizations: Not on file    Relationship status: Not on file  . Intimate partner violence:    Fear of current or ex partner: Not on file    Emotionally abused: Not on file    Physically abused: Not on file    Forced sexual activity: Not on file  Other Topics Concern  . Not on file  Social History  Narrative  . Not on file    Family History  Problem Relation Age of Onset  . Hypertension Mother   . Cancer Sister   . Cancer Brother   . Cancer Brother      Review of Systems  Constitutional: Negative.  Negative for chills and fever.  HENT: Negative.  Negative for congestion, hearing loss and sore throat.   Eyes: Negative.   Respiratory: Negative.  Negative for cough and shortness of breath.   Cardiovascular: Negative.  Negative for chest pain and palpitations.  Gastrointestinal: Negative.  Negative for abdominal pain, blood in stool, diarrhea, nausea and vomiting.  Genitourinary: Negative.  Negative for dysuria.  Musculoskeletal: Positive for back pain (Occasional lumbar pain with sciatica).  Skin: Negative.  Negative for rash.  Neurological: Negative.  Negative for dizziness and headaches.  Endo/Heme/Allergies: Negative.   All other systems reviewed and are negative.  Vitals:   09/10/18 1430  BP: 110/62  Pulse: 98  Resp: 18  Temp: 99.2 F (37.3 C)  SpO2: 99%     Physical Exam Vitals signs reviewed.  HENT:     Head: Normocephalic and atraumatic.     Nose: Nose normal.     Mouth/Throat:     Mouth: Mucous membranes are moist.     Pharynx: Oropharynx is clear.  Eyes:     Extraocular Movements: Extraocular movements intact.     Conjunctiva/sclera: Conjunctivae normal.     Pupils: Pupils are equal, round, and reactive to light.  Neck:     Musculoskeletal: Normal range of motion and neck supple.  Cardiovascular:     Rate and Rhythm: Normal rate and regular rhythm.     Pulses: Normal pulses.     Heart sounds: Normal heart sounds.  Pulmonary:     Effort: Pulmonary effort is normal.     Breath sounds: Normal breath sounds.  Abdominal:     General: Bowel sounds are normal. There is no distension.     Palpations: Abdomen is soft.     Tenderness: There is no abdominal tenderness.     Hernia: No hernia is present.  Musculoskeletal: Normal range of motion.  Skin:     General: Skin is warm and dry.     Capillary Refill: Capillary refill takes less than 2 seconds.  Neurological:     General: No focal deficit present.     Mental Status: He is alert and oriented to person, place, and time.  Psychiatric:        Mood and Affect: Mood normal.  Behavior: Behavior normal.      ASSESSMENT & PLAN: Yordi was seen today for annual exam and medication refill.  Diagnoses and all orders for this visit:  Routine general medical examination at a health care facility  Essential hypertension -     lisinopril-hydrochlorothiazide (ZESTORETIC) 20-12.5 MG tablet; Take 1 tablet by mouth daily. -     amLODipine (NORVASC) 10 MG tablet; Take 1 tablet (10 mg total) by mouth daily. -     CBC with Differential/Platelet; Future -     Comprehensive metabolic panel; Future -     Hemoglobin A1c; Future -     Lipid panel; Future  Sciatica of right side -     lidocaine (LIDODERM) 5 %; Place 1 patch onto the skin daily. Remove & Discard patch within 12 hours or as directed by MD    Patient Instructions       If you have lab work done today you will be contacted with your lab results within the next 2 weeks.  If you have not heard from [place] then please contact us. The fastest way to get your results is to register for My Chart.   IF you received an x-ray today, you will receive an invoice from [facility]. Please contact [facility] at [phone] with questions or concerns regarding your invoice.   IF you received labwork today, you will receive an invoice from [facility]. Please contact [facility] at [phone] with questions or concerns regarding your invoice.   Our billing staff will not be able to assist you with questions regarding bills from these companies.  You will be contacted with the lab results as soon as they are available. The fastest way to get your results is to activate your My Chart account. Instructions are located on the  last page of this paperwork. If you have not heard from [place] regarding the results in 2 weeks, please contact this office.      Health Maintenance, Male A healthy lifestyle and preventive care is important for your health and wellness. Ask your health care provider about what schedule of regular examinations is right for you. What should I know about weight and diet? Eat a Healthy Diet  Eat plenty of vegetables, fruits, whole grains, low-fat dairy products, and lean protein.  Do not eat a lot of foods high in solid fats, added sugars, or salt.  Maintain a Healthy Weight Regular exercise can help you achieve or maintain a healthy weight. You should:  Do at least 150 minutes of exercise each week. The exercise should increase your heart rate and make you sweat (moderate-intensity exercise).  Do strength-training exercises at least twice a week. Watch Your Levels of Cholesterol and Blood Lipids  Have your blood tested for lipids and cholesterol every 5 years starting at 61 years of age. If you are at high risk for heart disease, you should start having your blood tested when you are 61 years old. You may need to have your cholesterol levels checked more often if: ? Your lipid or cholesterol levels are high. ? You are older than 61 years of age. ? You are at high risk for heart disease. What should I know about cancer screening? Many types of cancers can be detected early and may often be prevented. Lung Cancer  You should be screened every year for lung cancer if: ? You are a current smoker who has smoked for at least 30 years. ? You are a former smoker who has  quit within the past 15 years.  Talk to your health care provider about your screening options, when you should start screening, and how often you should be screened. Colorectal Cancer  Routine colorectal cancer screening usually begins at 61 years of age and should be repeated every 5-10 years until you are 61 years old. You  may need to be screened more often if early forms of precancerous polyps or small growths are found. Your health care provider may recommend screening at an earlier age if you have risk factors for colon cancer.  Your health care provider may recommend using home test kits to check for hidden blood in the stool.  A small camera at the end of a tube can be used to examine your colon (sigmoidoscopy or colonoscopy). This checks for the earliest forms of colorectal cancer. Prostate and Testicular Cancer  Depending on your age and overall health, your health care provider may do certain tests to screen for prostate and testicular cancer.  Talk to your health care provider about any symptoms or concerns you have about testicular or prostate cancer. Skin Cancer  Check your skin from head to toe regularly.  Tell your health care provider about any new moles or changes in moles, especially if: ? There is a change in a mole's size, shape, or color. ? You have a mole that is larger than a pencil eraser.  Always use sunscreen. Apply sunscreen liberally and repeat throughout the day.  Protect yourself by wearing long sleeves, pants, a wide-brimmed hat, and sunglasses when outside. What should I know about heart disease, diabetes, and high blood pressure?  If you are 30-39 years of age, have your blood pressure checked every 3-5 years. If you are 5 years of age or older, have your blood pressure checked every year. You should have your blood pressure measured twice-once when you are at a hospital or clinic, and once when you are not at a hospital or clinic. Record the average of the two measurements. To check your blood pressure when you are not at a hospital or clinic, you can use: ? An automated blood pressure machine at a pharmacy. ? A home blood pressure monitor.  Talk to your health care provider about your target blood pressure.  If you are between 6-77 years old, ask your health care provider  if you should take aspirin to prevent heart disease.  Have regular diabetes screenings by checking your fasting blood sugar level. ? If you are at a normal weight and have a low risk for diabetes, have this test once every three years after the age of 63. ? If you are overweight and have a high risk for diabetes, consider being tested at a younger age or more often.  A one-time screening for abdominal aortic aneurysm (AAA) by ultrasound is recommended for men aged 69-75 years who are current or former smokers. What should I know about preventing infection? Hepatitis B If you have a higher risk for hepatitis B, you should be screened for this virus. Talk with your health care provider to find out if you are at risk for hepatitis B infection. Hepatitis C Blood testing is recommended for:  Everyone born from [birth year] through 1963.  Anyone with known risk factors for hepatitis C. Sexually Transmitted Diseases (STDs)  You should be screened each year for STDs including gonorrhea and chlamydia if: ? You are sexually active and are younger than 61 years of age. ? You are older than 61  years of age and your health care provider tells you that you are at risk for this type of infection. ? Your sexual activity has changed since you were last screened and you are at an increased risk for chlamydia or gonorrhea. Ask your health care provider if you are at risk.  Talk with your health care provider about whether you are at high risk of being infected with HIV. Your health care provider may recommend a prescription medicine to help prevent HIV infection. What else can I do?  Schedule regular health, dental, and eye exams.  Stay current with your vaccines (immunizations).  Do not use any tobacco products, such as cigarettes, chewing tobacco, and e-cigarettes. If you need help quitting, ask your health care provider.  Limit alcohol intake to no more than 2 drinks per day. One drink equals 12 ounces of  beer, 5 ounces of wine, or 1 ounces of hard liquor.  Do not use street drugs.  Do not share needles.  Ask your health care provider for help if you need support or information about quitting drugs.  Tell your health care provider if you often feel depressed.  Tell your health care provider if you have ever been abused or do not feel safe at home. This information is not intended to replace advice given to you by your health care provider. Make sure you discuss any questions you have with your health care provider. Document Released: 09/23/2007 Document Revised: 11/24/2015 Document Reviewed: 12/29/2014 Elsevier Interactive Patient Education  2019 Elsevier Inc.      Agustina Caroli, MD [facility]

## 2018-09-10 NOTE — Patient Instructions (Addendum)

## 2018-11-06 ENCOUNTER — Ambulatory Visit (INDEPENDENT_AMBULATORY_CARE_PROVIDER_SITE_OTHER): Payer: No Typology Code available for payment source | Admitting: Family Medicine

## 2018-11-06 ENCOUNTER — Other Ambulatory Visit: Payer: Self-pay

## 2018-11-06 ENCOUNTER — Encounter: Payer: Self-pay | Admitting: Family Medicine

## 2018-11-06 VITALS — BP 160/82 | HR 84 | Temp 98.4°F | Resp 12 | Ht 67.0 in | Wt 203.0 lb

## 2018-11-06 DIAGNOSIS — R200 Anesthesia of skin: Secondary | ICD-10-CM | POA: Diagnosis not present

## 2018-11-06 DIAGNOSIS — R600 Localized edema: Secondary | ICD-10-CM

## 2018-11-06 DIAGNOSIS — I10 Essential (primary) hypertension: Secondary | ICD-10-CM

## 2018-11-06 DIAGNOSIS — G8929 Other chronic pain: Secondary | ICD-10-CM | POA: Insufficient documentation

## 2018-11-06 DIAGNOSIS — M5441 Lumbago with sciatica, right side: Secondary | ICD-10-CM | POA: Diagnosis not present

## 2018-11-06 DIAGNOSIS — E538 Deficiency of other specified B group vitamins: Secondary | ICD-10-CM | POA: Insufficient documentation

## 2018-11-06 MED ORDER — AMLODIPINE BESYLATE 5 MG PO TABS: 5.0000 mg | ORAL_TABLET | Freq: Every day | ORAL | 2 refills | Status: DC

## 2018-11-06 MED ORDER — DULOXETINE HCL 30 MG PO CPEP: 30.0000 mg | ORAL_CAPSULE | Freq: Every day | ORAL | 1 refills | Status: DC

## 2018-11-06 MED ORDER — LISINOPRIL-HYDROCHLOROTHIAZIDE 20-12.5 MG PO TABS: 2.0000 | ORAL_TABLET | Freq: Every day | ORAL | 3 refills | Status: DC

## 2018-11-06 MED FILL — AMLODIPINE BESYLATE 5 MG TA: 5 | 30 days supply | Qty: 30 | Fill #0

## 2018-11-06 MED FILL — DULoxetine HCL 30 MG CPEP: 30 | 30 days supply | Qty: 30 | Fill #0

## 2018-11-06 NOTE — Progress Notes (Signed)
HPI:   [name] is a 63 y.o. male, who is here today to establish care.  Former PCP: Dr Aaron Bowman Last preventive routine visit: 09/10/18  Chronic medical problems: HTN,HLD,back pain, low testosterone level,and OA among some.   HTN Dx in 1998. He is on Amlodipine 10 mg daily and Lisinopril-HCTZ 20-12.5 mg daily. Denies severe/frequent headache, visual changes, chest pain, dyspnea, palpitation, claudication, or focal weakness.  BP reading at home: 140's/80's.  Lab Results  Component Value Date   CREATININE 1.04 06/27/2018   BUN 14 06/27/2018   NA 137 06/27/2018   K 3.7 06/27/2018   CL 100 06/27/2018   CO2 19 (L) 06/27/2018    Lower back pain radiated to RLE for about 1-2 years. He tried oral prednisone and completed PT,neither one help.  Lumbar MRI has been done. some  Followed with ortho and epidural injection was recommended but according to pt,his health insurance did not cover it.  Pain is intermittent,it is alleviated by rest and exacerbated by prolonged standing or walking. He enjoys playing soccer,walking,and hiking but these activities exacerbated pain. + Burning sensation right lateral thigh and right foot. Bilateral foot numbness.  Lidoderm path helps some.  Concerns today: LE edema noted after he started Amlodipine. It is worse at the end of the day and almost gone when he gets up first thing in the morning. He has not noted erythema,skin rash,or pain.  He was on B12 supplementation, 1000 mcg IM daily x a week and now on B12 oral supplementation.   Review of Systems  Constitutional: Negative for chills and fever.  HENT: Negative for mouth sores and sore throat.   Respiratory: Negative for cough and wheezing.   Gastrointestinal: Negative for abdominal pain, nausea and vomiting.  Endocrine: Negative for cold intolerance and heat intolerance.  Genitourinary: Negative for decreased urine volume, dysuria and hematuria.  Musculoskeletal:  Negative for gait problem and neck pain.  Skin: Negative for rash and wound.  Neurological: Negative for syncope and facial asymmetry.  Psychiatric/Behavioral: Negative for confusion. The patient is nervous/anxious.   Rest see pertinent positives and negatives per HPI.   Current Outpatient Medications on File Prior to Visit  Medication Sig Dispense Refill  . cholecalciferol (VITAMIN D) 400 units TABS tablet Take 400 Units by mouth daily.    . ferrous sulfate 325 (65 FE) MG EC tablet Take 325 mg by mouth 1 day or 1 dose.    . lidocaine (LIDODERM) 5 % Place 1 patch onto the skin daily. Remove & Discard patch within 12 hours or as directed by MD 30 patch 0  . Omega-3 Fatty Acids (FISH OIL) 1000 MG CAPS Take 1,000 mg by mouth.    . vitamin B-12 (CYANOCOBALAMIN) 100 MCG tablet Take 100 mcg by mouth daily.    . vitamin E 1000 UNIT capsule Take 1,000 Units by mouth daily.     No current facility-administered medications on file prior to visit.      Past Medical History:  Diagnosis Date  . Arthritis   . GERD (gastroesophageal reflux disease)   . Hypertension   . Tuberculosis    Not on File  Family History  Problem Relation Age of Onset  . Hypertension Mother   . Cancer Sister   . Cancer Brother   . Cancer Brother     Social History   Socioeconomic History  . Marital status: Married    Spouse name: Not on file  . Number of children: Not  on file  . Years of education: Not on file  . Highest education level: Not on file  Occupational History  . Occupation: Glass blower/designer  Social Needs  . Financial resource strain: Not on file  . Food insecurity    Worry: Not on file    Inability: Not on file  . Transportation needs    Medical: Not on file    Non-medical: Not on file  Tobacco Use  . Smoking status: Never Smoker  . Smokeless tobacco: Never Used  Substance and Sexual Activity  . Alcohol use: Yes    Alcohol/week: 5.0 standard drinks    Types: 5 Cans of beer per week   . Drug use: No  . Sexual activity: Yes  Lifestyle  . Physical activity    Days per week: Not on file    Minutes per session: Not on file  . Stress: Not on file  Relationships  . Social Herbalist on phone: Not on file    Gets together: Not on file    Attends religious service: Not on file    Active member of club or organization: Not on file    Attends meetings of clubs or organizations: Not on file    Relationship status: Not on file  Other Topics Concern  . Not on file  Social History Narrative  . Not on file    Vitals:   11/06/18 1539  BP: (!) 160/82  Pulse: 84  Resp: 12  Temp: 98.4 F (36.9 C)  SpO2: 95%    Body mass index is 31.79 kg/m.   Physical Exam  Nursing note and vitals reviewed. Constitutional: He is oriented to person, place, and time. He appears well-developed. No distress.  HENT:  Head: Normocephalic and atraumatic.  Mouth/Throat: Oropharynx is clear and moist and mucous membranes are normal.  Eyes: Pupils are equal, round, and reactive to light. Conjunctivae are normal.  Cardiovascular: Normal rate and regular rhythm.  No murmur heard. Pulses:      Dorsalis pedis pulses are 2+ on the right side and 2+ on the left side.  Respiratory: Effort normal and breath sounds normal. No respiratory distress.  GI: Soft. He exhibits no mass. There is no hepatomegaly. There is no abdominal tenderness.  Musculoskeletal:        General: Edema (1+ pitting edema,bilateral.) present.     Lumbar back: He exhibits no tenderness, no edema and no deformity.  Lymphadenopathy:    He has no cervical adenopathy.  Neurological: He is alert and oriented to person, place, and time. He has normal strength. No cranial nerve deficit or sensory deficit. Gait normal.  Reflex Scores:      Patellar reflexes are 2+ on the right side and 2+ on the left side. Skin: Skin is warm. No rash noted. No erythema.  Psychiatric: He has a normal mood and affect.  Well groomed,  good eye contact.   ASSESSMENT AND PLAN:  [name] was seen today to establish care and to address a few concerns. Diagnoses and all orders for this visit:  Orders Placed This Encounter  Procedures  . Vitamin B12  . Basic metabolic panel   Lab Results  Component Value Date   VITAMINB12 >1500 (H) 11/06/2018   Lab Results  Component Value Date   CREATININE 1.05 11/06/2018   BUN 15 11/06/2018   NA 137 11/06/2018   K 3.7 11/06/2018   CL 103 11/06/2018   CO2 23 11/06/2018    Bilateral  lower extremity edema We discussed possible etiologies, including vein disease and medications. Amlodipine seems to be the major aggravating factors,he agrees to continue it but a lower dose. LE elevation and compression stocking may also help. Instructed about warning signs.  Essential hypertension Rechecked: 155/90. BP is not adequately controlled. We discussed possible complications of elevated BP. Lisinopril-HCTZ 20-12.5 mg increased from 1 tablet daily to 2 tablets daily. Amlodipine decreased from 10 mg to 5 mg. We discussed side effects of medications. Recommend monitoring BP regularly. Low-salt diet. Follow-up in 4 weeks.  Lower extremity numbness/burning sensation left thigh and foot Chronic and stable. We discussed possible etiologies but most likely related to lower back pain. After discussion of a few pharmacologic treatment options, he agrees with trying Cymbalta 30 mg daily. We discussed side effects of medication. Follow-up in 4 weeks, when we can increase dose of Cymbalta from 30 mg to 60 mg if it has been well-tolerated. Instructed about warning signs.  B12 deficiency Continue daily B12 supplementation. Further recommendation will be given according to lab results.  Chronic low back pain with right-sided sciatica Problem has been stable for a year. He has follow-up with orthopedics and epidural injections were not covered under his health insurance. Cymbalta 30 mg  started today. Instructed about warning signs. Follow-up in 4 weeks.    Return in about 4 weeks (around 12/04/2018) for 4-6 week HTN.     Betty G. Martinique, MD  [facility]. [place] office.

## 2018-11-06 NOTE — Assessment & Plan Note (Signed)
Problem has been stable for a year. He has follow-up with orthopedics and epidural injections were not covered under his health insurance. Cymbalta 30 mg started today. Instructed about warning signs. Follow-up in 4 weeks.

## 2018-11-06 NOTE — Assessment & Plan Note (Signed)
Chronic and stable. We discussed possible etiologies but most likely related to lower back pain. After discussion of a few pharmacologic treatment options, he agrees with trying Cymbalta 30 mg daily. We discussed side effects of medication. Follow-up in 4 weeks, when we can increase dose of Cymbalta from 30 mg to 60 mg if it has been well-tolerated. Instructed about warning signs.

## 2018-11-06 NOTE — Assessment & Plan Note (Signed)
Continue daily B12 supplementation. Further recommendation will be given according to lab results.

## 2018-11-06 NOTE — Patient Instructions (Signed)
A few things to remember from today's visit:   Bilateral lower extremity edema  B12 deficiency - Plan: Vitamin B12  Chronic low back pain with right-sided sciatica, unspecified back pain laterality - Plan: DULoxetine (CYMBALTA) 30 MG capsule  Lower extremity numbness - Plan: DULoxetine (CYMBALTA) 30 MG capsule, Basic metabolic panel  Essential hypertension - Plan: amLODipine (NORVASC) 5 MG tablet, lisinopril-hydrochlorothiazide (ZESTORETIC) 20-12.5 MG tablet, Basic metabolic panel  Amlodipine decreased a 5 mg en las noche. Lisinopril-HCTZ cambiado a 2 tab al dia.  Cymbalta empezado hoy pare tratar sensacion of quemazon en la pierna.   Please be sure medication list is accurate. If a new problem present, please set up appointment sooner than planned today.

## 2018-11-06 NOTE — Assessment & Plan Note (Addendum)
Rechecked: 155/90. BP is not adequately controlled. We discussed possible complications of elevated BP. Lisinopril-HCTZ 20-12.5 mg increased from 1 tablet daily to 2 tablets daily. Amlodipine decreased from 10 mg to 5 mg. We discussed side effects of medications. Recommend monitoring BP regularly. Low-salt diet. Follow-up in 4 weeks.

## 2018-11-07 LAB — BASIC METABOLIC PANEL
BUN: 15 mg/dL (ref 6–23)
CO2: 23 mEq/L (ref 19–32)
Calcium: 9.4 mg/dL (ref 8.4–10.5)
Chloride: 103 mEq/L (ref 96–112)
Creatinine, Ser: 1.05 mg/dL (ref 0.40–1.50)
GFR: 71.71 mL/min (ref >60.00)
Glucose, Bld: 87 mg/dL (ref 70–99)
Potassium: 3.7 mEq/L (ref 3.5–5.1)
Sodium: 137 mEq/L (ref 135–145)

## 2018-11-07 LAB — VITAMIN B12: Vitamin B-12: >1500 pg/mL — ABNORMAL HIGH (ref 211–911)

## 2018-11-09 ENCOUNTER — Encounter: Payer: Self-pay | Admitting: Family Medicine

## 2018-12-09 MED FILL — DULoxetine HCL 30 MG CPEP: 30 | 30 days supply | Qty: 30 | Fill #1

## 2018-12-09 MED FILL — AMLODIPINE BESYLATE 5 MG TA: 5 | 30 days supply | Qty: 30 | Fill #1

## 2018-12-10 ENCOUNTER — Encounter: Payer: Self-pay | Admitting: Family Medicine

## 2018-12-10 ENCOUNTER — Other Ambulatory Visit: Payer: Self-pay

## 2018-12-10 ENCOUNTER — Ambulatory Visit (INDEPENDENT_AMBULATORY_CARE_PROVIDER_SITE_OTHER): Payer: No Typology Code available for payment source | Admitting: Family Medicine

## 2018-12-10 VITALS — BP 140/88 | HR 88 | Temp 97.9°F | Ht 67.0 in | Wt 200.4 lb

## 2018-12-10 DIAGNOSIS — M5441 Lumbago with sciatica, right side: Secondary | ICD-10-CM

## 2018-12-10 DIAGNOSIS — Z23 Encounter for immunization: Secondary | ICD-10-CM

## 2018-12-10 DIAGNOSIS — I10 Essential (primary) hypertension: Secondary | ICD-10-CM

## 2018-12-10 DIAGNOSIS — R200 Anesthesia of skin: Secondary | ICD-10-CM | POA: Diagnosis not present

## 2018-12-10 DIAGNOSIS — R600 Localized edema: Secondary | ICD-10-CM | POA: Diagnosis not present

## 2018-12-10 DIAGNOSIS — G8929 Other chronic pain: Secondary | ICD-10-CM

## 2018-12-10 DIAGNOSIS — R1010 Upper abdominal pain, unspecified: Secondary | ICD-10-CM

## 2018-12-10 MED ORDER — DULOXETINE HCL 30 MG PO CPEP: 60.0000 mg | ORAL_CAPSULE | Freq: Every day | ORAL | 1 refills | Status: DC

## 2018-12-10 NOTE — Patient Instructions (Addendum)
A few things to remember from today's visit:   Bilateral lower extremity edema  Essential hypertension  Chronic low back pain with right-sided sciatica, unspecified back pain laterality - Plan: DULoxetine (CYMBALTA) 30 MG capsule  Lower extremity numbness - Plan: DULoxetine (CYMBALTA) 30 MG capsule  Upper abdominal pain  Presion debe ser menos de 140/90.  Tomese las 2 pastillas pare le presion juntas. Numeros de presion arterial en 2 semanas.  Lidocaine patch en estomago donde le duele.   Please be sure medication list is accurate. If a new problem present, please set up appointment sooner than planned today.

## 2018-12-10 NOTE — Progress Notes (Signed)
HPI:   Aaron Bowman is a 63 y.o. male, who is here today to follow on recent OV. He was last seen on 11/06/2018, no new problems since his last visit.  [id number] was decreased from 10 mg to 5 mg and Lisinopril-HCTZ 20-12.5 mg increased to 2 tabs daily. He sometimes forgets taking 2nd dose of Lisinopril-HCTZ. Home BP's 140's/80's. Denies severe/frequent headache, visual changes, chest pain, dyspnea, palpitation, claudication,or focal weakness.  Lower extremity edema has improved. No erythema or pain.   Lab Results  Component Value Date   CREATININE 1.05 11/06/2018   BUN 15 11/06/2018   NA 137 11/06/2018   K 3.7 11/06/2018   CL 103 11/06/2018   CO2 23 11/06/2018   Lower back pain radiated to RLE for about 1-2 years. Burning sensation lateral aspect of right thigh and right foot. + Bilateral foot numbness.  Not interested in epidural injections due to cost. Last OV Cymbalta 30 mg was started. He has noted that pain during the day is not longer present He is still having some pain at night,worse when lying down and better when he gets up. He has not noted side effects with Cymbalta.  Today he is complaining about RUQ and RLQ cramp-like abdominal pain exacerbated by bending down(like to tie shoes) and last a few minutes after he straights up.  He has not noted a skin rash.  According to patient he had ultrasound done and he was "fine." Denies associated nausea, vomiting, or changes in bowel habits. Problem has been going on since 05/2018 and has been stable. It does not happen daily.  RUQ [place] in 08/2018:Negative.   Review of Systems  Constitutional: Negative for chills, fatigue and fever.  HENT: Negative for mouth sores, nosebleeds and sore throat.   Respiratory: Negative for cough and wheezing.   Gastrointestinal: Negative for abdominal distention.  Endocrine: Negative for cold intolerance and heat intolerance.  Genitourinary: Negative for decreased urine  volume, dysuria and hematuria.  Musculoskeletal: Negative for gait problem.  Neurological: Negative for syncope and facial asymmetry.  Psychiatric/Behavioral: Negative for confusion. The patient is nervous/anxious.   Rest see pertinent positives and negatives per HPI.   Current Outpatient Medications on File Prior to Visit  Medication Sig Dispense Refill  . amLODipine (NORVASC) 5 MG tablet Take 1 tablet (5 mg total) by mouth daily. 30 tablet 2  . aspirin EC 81 MG tablet Take by mouth.    . cholecalciferol (VITAMIN D) 400 units TABS tablet Take 400 Units by mouth daily.    . ferrous sulfate 325 (65 FE) MG EC tablet Take 325 mg by mouth 1 day or 1 dose.    . lidocaine (LIDODERM) 5 % Place 1 patch onto the skin daily. Remove & Discard patch within 12 hours or as directed by MD 30 patch 0  . lisinopril-hydrochlorothiazide (ZESTORETIC) 20-12.5 MG tablet Take 2 tablets by mouth daily. 60 tablet 3  . meloxicam (MOBIC) 15 MG tablet Take by mouth.    . Omega-3 Fatty Acids (FISH OIL) 1000 MG CAPS Take 1,000 mg by mouth.    . vitamin B-12 (CYANOCOBALAMIN) 100 MCG tablet Take 100 mcg by mouth daily.    . vitamin E 1000 UNIT capsule Take 1,000 Units by mouth daily.     No current facility-administered medications on file prior to visit.      Past Medical History:  Diagnosis Date  . Arthritis   . GERD (gastroesophageal reflux disease)   . Hypertension   .  Tuberculosis    Not on File  Social History   Socioeconomic History  . Marital status: Married    Spouse name: Not on file  . Number of children: Not on file  . Years of education: Not on file  . Highest education level: Not on file  Occupational History  . Occupation: Glass blower/designer  Social Needs  . Financial resource strain: Not on file  . Food insecurity    Worry: Not on file    Inability: Not on file  . Transportation needs    Medical: Not on file    Non-medical: Not on file  Tobacco Use  . Smoking status: Never Smoker   . Smokeless tobacco: Never Used  Substance and Sexual Activity  . Alcohol use: Yes    Alcohol/week: 5.0 standard drinks    Types: 5 Cans of beer per week  . Drug use: No  . Sexual activity: Yes  Lifestyle  . Physical activity    Days per week: Not on file    Minutes per session: Not on file  . Stress: Not on file  Relationships  . Social Herbalist on phone: Not on file    Gets together: Not on file    Attends religious service: Not on file    Active member of club or organization: Not on file    Attends meetings of clubs or organizations: Not on file    Relationship status: Not on file  Other Topics Concern  . Not on file  Social History Narrative  . Not on file    Vitals:   12/10/18 1523  BP: 140/88  Pulse: 88  Temp: 97.9 F (36.6 C)  SpO2: 97%   Body mass index is 31.38 kg/m.   Physical Exam  Nursing note and vitals reviewed. Constitutional: He is oriented to person, place, and time. He appears well-developed. No distress.  HENT:  Head: Normocephalic and atraumatic.  Mouth/Throat: Oropharynx is clear and moist and mucous membranes are normal.  Eyes: Pupils are equal, round, and reactive to light. Conjunctivae are normal.  Cardiovascular: Normal rate and regular rhythm.  No murmur heard. Pulses:      Dorsalis pedis pulses are 2+ on the right side and 2+ on the left side.  Respiratory: Effort normal and breath sounds normal. No respiratory distress.  GI: Soft. Bowel sounds are normal. He exhibits no mass. There is no hepatomegaly. There is no abdominal tenderness.  Musculoskeletal:        General: Edema (Trace pitting edema,LE bilateral.) present. No tenderness.  Lymphadenopathy:    He has no cervical adenopathy.  Neurological: He is alert and oriented to person, place, and time. He has normal strength. No cranial nerve deficit. Gait normal.  Skin: Skin is warm. No rash noted. No erythema.  Psychiatric: He has a normal mood and affect.  Well  groomed, good eye contact.    ASSESSMENT AND PLAN:  Mr. Vasco was seen today for follow-up.  Diagnoses and all orders for this visit:  Orders Placed This Encounter  Procedures  . Flu Vaccine QUAD 36+ mos IM    Bilateral lower extremity edema Improved with decreasing Amlodipine dose. Continue LE elevations as needed and appropriate skin care. It may get worse during hot weather,so recommend considering compression stockings.  Essential hypertension BP mildly elevated. Recommend taking Lisinopril-HCTZ 20-12.5 mg together for better compliance. No changes in Amlodipine dose. Low salt diet to continue. He will let us know about BP readings in  2 weeks.  Chronic low back pain with right-sided sciatica, unspecified back pain laterality Improved. Tolerating Cymbalta well.He agrees with trying Cymbalta 60 mg and if he does not noted significant improvement,he can go back to 30 mg. Her has some Cymbalta 30 mg,so he will start taking 2 tabs.  -     DULoxetine (CYMBALTA) 30 MG capsule; Take 2 capsules (60 mg total) by mouth daily.  Lower extremity numbness Improved. Appropriate skin care. Instructed about warning signs. Cymbalta increased from 30 mg to 60 mg daily.  -     DULoxetine (CYMBALTA) 30 MG capsule; Take 2 capsules (60 mg total) by mouth daily.  Upper abdominal pain Pain is not reproducible with palpation,rib cages pressure,coughing,or turning back. When he bends down he has mild pain. ? Muscular pain,?radicular. Trial of lidocaine patch on affected area.  Need for influenza vaccination -     Flu Vaccine QUAD 36+ mos IM    Return in about 3 months (around 03/11/2019) for HTN,RUQ and RLQ pain,back pain.   Sarahy Creedon G. Martinique, MD  [facility]. [place] office.

## 2018-12-12 MED ORDER — AMLODIPINE BESYLATE 5 MG PO TABS: 5.0000 mg | ORAL_TABLET | Freq: Every day | ORAL | 2 refills | Status: DC

## 2018-12-12 MED ORDER — LISINOPRIL-HYDROCHLOROTHIAZIDE 20-12.5 MG PO TABS: 2.0000 | ORAL_TABLET | Freq: Every day | ORAL | 2 refills | Status: DC

## 2018-12-13 MED FILL — LISINOPRIL-HCTZ 20-12.5 MG: 20-12.5 | 90 days supply | Qty: 180 | Fill #0

## 2019-01-08 ENCOUNTER — Encounter: Payer: Self-pay | Admitting: Family Medicine

## 2019-01-08 ENCOUNTER — Other Ambulatory Visit: Payer: Self-pay | Admitting: Family Medicine

## 2019-01-08 MED ORDER — SPIRONOLACTONE 25 MG PO TABS: 12.5000 mg | ORAL_TABLET | Freq: Every day | ORAL | 1 refills | Status: DC

## 2019-01-09 ENCOUNTER — Encounter: Payer: Self-pay | Admitting: Family Medicine

## 2019-01-09 MED FILL — LISINOPRIL-HCTZ 20-12.5 MG: 20-12.5 | 90 days supply | Qty: 180 | Fill #0

## 2019-01-09 MED FILL — AMLODIPINE BESYLATE 5 MG TA: 5 | 90 days supply | Qty: 90 | Fill #0

## 2019-01-13 ENCOUNTER — Encounter: Payer: Self-pay | Admitting: Family Medicine

## 2019-01-14 ENCOUNTER — Encounter: Payer: Self-pay | Admitting: Family Medicine

## 2019-01-15 ENCOUNTER — Encounter: Payer: Self-pay | Admitting: Family Medicine

## 2019-01-17 ENCOUNTER — Other Ambulatory Visit: Payer: Self-pay | Admitting: Family Medicine

## 2019-01-17 DIAGNOSIS — I10 Essential (primary) hypertension: Secondary | ICD-10-CM

## 2019-01-17 MED ORDER — LISINOPRIL-HYDROCHLOROTHIAZIDE 20-12.5 MG PO TABS: 2.0000 | ORAL_TABLET | Freq: Every day | ORAL | 2 refills | Status: DC

## 2019-01-17 MED ORDER — SPIRONOLACTONE 25 MG PO TABS: 12.5000 mg | ORAL_TABLET | Freq: Every day | ORAL | 1 refills | Status: DC

## 2019-01-17 MED ORDER — AMLODIPINE BESYLATE 5 MG PO TABS: 5.0000 mg | ORAL_TABLET | Freq: Every day | ORAL | 2 refills | Status: DC

## 2019-01-17 MED FILL — SPIRONOLACTONE 25 MG TABS: 25 | 90 days supply | Qty: 45 | Fill #0

## 2019-01-28 MED FILL — TAMSULOSIN HCL 0.4 MG CAP: 0.4 | 30 days supply | Qty: 30 | Fill #0

## 2019-03-04 ENCOUNTER — Ambulatory Visit: Payer: No Typology Code available for payment source

## 2019-03-11 ENCOUNTER — Ambulatory Visit: Payer: No Typology Code available for payment source | Admitting: Emergency Medicine

## 2019-03-12 ENCOUNTER — Other Ambulatory Visit: Payer: Self-pay

## 2019-03-12 ENCOUNTER — Ambulatory Visit (INDEPENDENT_AMBULATORY_CARE_PROVIDER_SITE_OTHER): Payer: No Typology Code available for payment source | Admitting: Family Medicine

## 2019-03-12 ENCOUNTER — Encounter: Payer: Self-pay | Admitting: Family Medicine

## 2019-03-12 VITALS — BP 130/88 | HR 71 | Temp 97.6°F | Resp 16 | Ht 67.0 in | Wt 200.4 lb

## 2019-03-12 DIAGNOSIS — G8929 Other chronic pain: Secondary | ICD-10-CM

## 2019-03-12 DIAGNOSIS — N522 Drug-induced erectile dysfunction: Secondary | ICD-10-CM

## 2019-03-12 DIAGNOSIS — R1010 Upper abdominal pain, unspecified: Secondary | ICD-10-CM | POA: Diagnosis not present

## 2019-03-12 DIAGNOSIS — R200 Anesthesia of skin: Secondary | ICD-10-CM | POA: Diagnosis not present

## 2019-03-12 DIAGNOSIS — I10 Essential (primary) hypertension: Secondary | ICD-10-CM

## 2019-03-12 DIAGNOSIS — M5441 Lumbago with sciatica, right side: Secondary | ICD-10-CM

## 2019-03-12 MED ORDER — OMEPRAZOLE 20 MG PO CPDR: 20.0000 mg | DELAYED_RELEASE_CAPSULE | Freq: Every day | ORAL | 0 refills | Status: DC

## 2019-03-12 MED FILL — OMEPRAZOLE 20 MG CAP: 20 | 30 days supply | Qty: 30 | Fill #0

## 2019-03-12 NOTE — Progress Notes (Signed)
HPI:   [name] is a 63 y.o. male, who is here today for 3 months follow up.  He was last seen on 12/10/18.  HTN: Home BP readings  Denies severe/frequent headache, visual changes, chest pain, dyspnea, palpitation, claudication, focal weakness, or worsening edema.  He is on Spironolactone 25 mg 1/2 tab daily, Amlodipine 5 mg daily,and Lisinopril-HCTZ 20-12.5 mg 2 tabs daily. Tolerating medication well, no side effects. Home BP's 110-120/80's.  Lab Results  Component Value Date   CREATININE 1.05 11/06/2018   BUN 15 11/06/2018   NA 137 11/06/2018   K 3.7 11/06/2018   CL 103 11/06/2018   CO2 23 11/06/2018   Chronic back pain radiated to [id number] has been going on for 2 years. Cymbalta started in 10/2018, last visit dose was increased from 30 to 60 mg daily.  He discontinued Cymbalta, it caused ED when dose was increased. Medication was helping with pain. Pain is intermittent ., exacerbated by prolonged standing and walking. Burning like sensation lateral right thigh and right foot. Numbness and tingling foot,bilateral.  He has followed with ortho,completed PT and epidural injection was recommended but his insurance did not approve it.  He is a still complaining about RUQ abdominal pain, for about a year, usually in the morning and resolves after a few minutes. It is not exacerbated by food intake. He does not interfere with daily activities. Problem seems to be stable.   Review of Systems  Constitutional: Negative for activity change, appetite change, fatigue, fever and unexpected weight change.  HENT: Negative for nosebleeds, sore throat and trouble swallowing.   Eyes: Negative for pain and redness.  Respiratory: Negative for cough and wheezing.   Gastrointestinal: Negative for abdominal pain, nausea and vomiting.  Genitourinary: Negative for decreased urine volume, dysuria and hematuria.  Skin: Negative for rash and wound.  Neurological: Negative for  syncope, facial asymmetry and speech difficulty.  Rest of ROS, see pertinent positives sand negatives in HPI   Current Outpatient Medications on File Prior to Visit  Medication Sig Dispense Refill  . amLODipine (NORVASC) 5 MG tablet Take 1 tablet (5 mg total) by mouth daily. 90 tablet 2  . aspirin EC 81 MG tablet Take by mouth.    . cholecalciferol (VITAMIN D) 400 units TABS tablet Take 400 Units by mouth daily.    . DULoxetine (CYMBALTA) 30 MG capsule Take 2 capsules (60 mg total) by mouth daily. 30 capsule 1  . ferrous sulfate 325 (65 FE) MG EC tablet Take 325 mg by mouth 1 day or 1 dose.    . lidocaine (LIDODERM) 5 % Place 1 patch onto the skin daily. Remove & Discard patch within 12 hours or as directed by MD 30 patch 0  . lisinopril-hydrochlorothiazide (ZESTORETIC) 20-12.5 MG tablet Take 2 tablets by mouth daily. 180 tablet 2  . Omega-3 Fatty Acids (FISH OIL) 1000 MG CAPS Take 1,000 mg by mouth.    . spironolactone (ALDACTONE) 25 MG tablet Take 0.5 tablets (12.5 mg total) by mouth daily. 45 tablet 1  . vitamin B-12 (CYANOCOBALAMIN) 100 MCG tablet Take 100 mcg by mouth daily.    . vitamin E 1000 UNIT capsule Take 1,000 Units by mouth daily.     No current facility-administered medications on file prior to visit.      Past Medical History:  Diagnosis Date  . Arthritis   . GERD (gastroesophageal reflux disease)   . Hypertension   . Tuberculosis    Not  on File  Social History   Socioeconomic History  . Marital status: Married    Spouse name: Not on file  . Number of children: Not on file  . Years of education: Not on file  . Highest education level: Not on file  Occupational History  . Occupation: Glass blower/designer  Social Needs  . Financial resource strain: Not on file  . Food insecurity    Worry: Not on file    Inability: Not on file  . Transportation needs    Medical: Not on file    Non-medical: Not on file  Tobacco Use  . Smoking status: Never Smoker  .  Smokeless tobacco: Never Used  Substance and Sexual Activity  . Alcohol use: Yes    Alcohol/week: 5.0 standard drinks    Types: 5 Cans of beer per week  . Drug use: No  . Sexual activity: Yes  Lifestyle  . Physical activity    Days per week: Not on file    Minutes per session: Not on file  . Stress: Not on file  Relationships  . Social Herbalist on phone: Not on file    Gets together: Not on file    Attends religious service: Not on file    Active member of club or organization: Not on file    Attends meetings of clubs or organizations: Not on file    Relationship status: Not on file  Other Topics Concern  . Not on file  Social History Narrative  . Not on file    Vitals:   03/12/19 1536  BP: 130/88  Pulse: 71  Resp: 16  Temp: 97.6 F (36.4 C)  SpO2: 100%   Body mass index is 31.38 kg/m.   Physical Exam  Nursing note and vitals reviewed. Constitutional: He is oriented to person, place, and time. He appears well-developed. No distress.  HENT:  Head: Normocephalic and atraumatic.  Mouth/Throat: Oropharynx is clear and moist and mucous membranes are normal.  Eyes: Pupils are equal, round, and reactive to light. Conjunctivae are normal.  Cardiovascular: Normal rate and regular rhythm.  No murmur heard. Pulses:      Dorsalis pedis pulses are 2+ on the right side and 2+ on the left side.  Respiratory: Effort normal and breath sounds normal. No respiratory distress.  GI: Soft. He exhibits no mass. There is no hepatomegaly. There is no abdominal tenderness.  Musculoskeletal:        General: No edema.  Lymphadenopathy:    He has no cervical adenopathy.  Neurological: He is alert and oriented to person, place, and time. He has normal strength. No cranial nerve deficit. Gait normal.  Skin: Skin is warm. No rash noted. No erythema.  Psychiatric: He has a normal mood and affect.  Well groomed, good eye contact.   ASSESSMENT AND PLAN:   [name]  was seen today for 3 months follow-up.  Orders Placed This Encounter  Procedures  . Basic Metabolic Panel   Lab Results  Component Value Date   CREATININE 1.15 03/12/2019   BUN 17 03/12/2019   NA 136 03/12/2019   K 3.8 03/12/2019   CL 99 03/12/2019   CO2 29 03/12/2019    1. Essential hypertension BP adequately controlled. No changes in current management. Side effects discussed.  - Basic Metabolic Panel  2. Chronic low back pain with right-sided sciatica, unspecified back pain laterality He agrees with resuming lower dose Cymbalta. He is going to call insurance  company to inquire about epidural injection coverage.  3. Lower extremity numbness Improved with Cymbalta. He agrees with trying again medication,he was tolerating lower doses better. Instructed about warning signs.  4. Upper abdominal pain RUQ/epigastric pain is not reproducible. ?  Musculoskeletal, gastritis. He agrees with trying trial of omeprazole 20 mg 30-minute before breakfast for 3 to 4 weeks.  We also discussed the possibility of having an abdominal CT arranged given the fact that he is concerned about serious process, he prefers to hold on this for now he will let me know.  5.Drug-induced erectile dysfunction Side effects of SNRI's. He seemed to tolerate Cymbalta 30 mg better.  Return in about 6 months (around 09/10/2019) for cpe.   Terrian Sentell G. Martinique, MD  [facility]. [place] office.

## 2019-03-12 NOTE — Patient Instructions (Addendum)
A few things to remember from today's visit:   Essential hypertension  Bilateral lower extremity edema  Chronic low back pain with right-sided sciatica, unspecified back pain laterality  Lower extremity numbness  Otras opciones: medicamento para el dolor, Tramadol.  Para dolor de estomago trate Omeprazole 30 min anter de el desayuno.  Podemos considerar un scan de [employer].   Please be sure medication list is accurate. If a new problem present, please set up appointment sooner than planned today.

## 2019-03-13 LAB — BASIC METABOLIC PANEL
BUN: 17 mg/dL (ref 6–23)
CO2: 29 mEq/L (ref 19–32)
Calcium: 9.8 mg/dL (ref 8.4–10.5)
Chloride: 99 mEq/L (ref 96–112)
Creatinine, Ser: 1.15 mg/dL (ref 0.40–1.50)
GFR: 64.49 mL/min (ref >60.00)
Glucose, Bld: 86 mg/dL (ref 70–99)
Potassium: 3.8 mEq/L (ref 3.5–5.1)
Sodium: 136 mEq/L (ref 135–145)

## 2019-03-15 ENCOUNTER — Encounter: Payer: Self-pay | Admitting: Family Medicine

## 2019-03-21 MED FILL — OMEPRAZOLE 20 MG CAP: 20 | 30 days supply | Qty: 30 | Fill #0

## 2019-04-14 MED FILL — SPIRONOLACTONE 25 MG TABS: 25 | 90 days supply | Qty: 45 | Fill #0

## 2019-04-14 MED FILL — AMLODIPINE BESYLATE 5 MG TA: 5 | 90 days supply | Qty: 90 | Fill #0

## 2019-04-14 MED FILL — LISINOPRIL-HCTZ 20-12.5 MG: 20-12.5 | 90 days supply | Qty: 180 | Fill #0

## 2019-04-23 MED FILL — TAMSULOSIN HCL 0.4 MG CAP: 0.4 | 90 days supply | Qty: 90 | Fill #0

## 2019-05-06 ENCOUNTER — Other Ambulatory Visit: Payer: Self-pay | Admitting: Family Medicine

## 2019-05-06 MED FILL — OMEPRAZOLE 20 MG CAP: 20 | 90 days supply | Qty: 90 | Fill #0

## 2019-07-08 MED FILL — SPIRONOLACTONE 25 MG TABS: 25 | 90 days supply | Qty: 45 | Fill #1

## 2019-07-08 MED FILL — LISINOPRIL-HCTZ 20-12.5 MG: 20-12.5 | 90 days supply | Qty: 180 | Fill #1

## 2019-07-08 MED FILL — AMLODIPINE BESYLATE 5 MG TA: 5 | 90 days supply | Qty: 90 | Fill #1

## 2019-09-15 ENCOUNTER — Other Ambulatory Visit: Payer: Self-pay

## 2019-09-15 DIAGNOSIS — E785 Hyperlipidemia, unspecified: Secondary | ICD-10-CM | POA: Insufficient documentation

## 2019-09-15 NOTE — Progress Notes (Signed)
HPI:  [name] is a 64 y.o.male here today for his routine physical examination.  Last CPE: 09/10/18. He lives with his wife.  Regular exercise 3 or more times per week: Not consistently, has not exercised for about 2 months. Following a healthy diet: In general he does.  Chronic medical problems: HTN,B12 deficiency, and back pain with radiculopathy.  Immunization History  Administered Date(s) Administered  . Influenza,inj,Quad PF,6+ Mos 04/20/2016, 05/14/2017, 01/23/2018, 12/10/2018  . Influenza-Unspecified 01/03/2013  . Janssen (J&J) SARS-COV-2 Vaccination 06/14/2019  . Td 06/18/2007  . Tdap 07/13/2009, 01/03/2013, 06/27/2018  . Zoster Recombinat (Shingrix) 09/16/2019   -Hep C screening: Never done.   Last colon cancer screening: 04/10/2016. Last prostate ca screening: Follows annually with urologist. Last PSA normal at 1.1 in 01/2019.  -Negative for high alcohol intake, tobacco use, or Hx of illicit drug use.  -Concerns and/or follow up today:   C/O increased urinary frequency for the past 2 weeks. Negative for dysuria,gross hematuria,or decreased urine output. Hx of BPH,he was on Flomax before.  HTN: He is on Amlodipine 5 mg,Spironolactone 25 mg daily,and Lisinopril-HCTZ 20-12.5 mg 2 tabs daily. Tolerating medication well. Home BP's 110's/70's. Negative for unusual/severe headache,CP,orthopnea,PND,or SOB.  Lab Results  Component Value Date   CREATININE 1.15 03/12/2019   BUN 17 03/12/2019   NA 136 03/12/2019   K 3.8 03/12/2019   CL 99 03/12/2019   CO2 29 03/12/2019   HLD: He is on non pharmacologic treatment Lab Results  Component Value Date   CHOL 200 (H) 05/14/2017   HDL 45 05/14/2017   LDLCALC 131 (H) 05/14/2017   LDLDIRECT 157.6 06/25/2007   TRIG 122 05/14/2017   CHOLHDL 4.4 05/14/2017   Lab Results  Component Value Date   WBC 6.0 06/27/2018   HGB 14.6 06/27/2018   HCT 41.3 06/27/2018   MCV 91 06/27/2018   PLT 277 06/27/2018    Today he is c/o toenail coloration changes and rounded hyperpigmented subungual lesion left great toe nail. There is no pain. No hx of trauma. He has not tried OTC products.  Review of Systems  Constitutional: Negative for activity change, appetite change, fatigue and fever.  HENT: Negative for dental problem, mouth sores, nosebleeds and sore throat.   Eyes: Negative for redness and visual disturbance.  Respiratory: Negative for cough, shortness of breath and wheezing.   Cardiovascular: Negative for chest pain, palpitations and leg swelling.  Gastrointestinal: Negative for abdominal pain, blood in stool, nausea and vomiting.  Endocrine: Negative for cold intolerance, heat intolerance, polydipsia, polyphagia and polyuria.  Genitourinary: Negative for genital sores and testicular pain.  Musculoskeletal: Positive for back pain. Negative for gait problem and myalgias.  Skin: Negative for color change and rash.  Neurological: Negative for syncope, weakness and headaches.  Hematological: Negative for adenopathy. Does not bruise/bleed easily.  Psychiatric/Behavioral: Negative for confusion and sleep disturbance.  All other systems reviewed and are negative.  Current Outpatient Medications on File Prior to Visit  Medication Sig Dispense Refill  . amLODipine (NORVASC) 5 MG tablet Take 1 tablet (5 mg total) by mouth daily. 90 tablet 2  . aspirin EC 81 MG tablet Take by mouth.    . cholecalciferol (VITAMIN D) 400 units TABS tablet Take 400 Units by mouth daily.    . ferrous sulfate 325 (65 FE) MG EC tablet Take 325 mg by mouth 1 day or 1 dose.    . lidocaine (LIDODERM) 5 % Place 1 patch onto the skin daily.  Remove & Discard patch within 12 hours or as directed by MD 30 patch 0  . lisinopril-hydrochlorothiazide (ZESTORETIC) 20-12.5 MG tablet Take 2 tablets by mouth daily. 180 tablet 2  . Omega-3 Fatty Acids (FISH OIL) 1000 MG CAPS Take 1,000 mg by mouth.    Marland Kitchen omeprazole (PRILOSEC) 20 MG capsule  TAKE 1 CAPSULE BY MOUTH ONCE DAILY 90 capsule 1  . spironolactone (ALDACTONE) 25 MG tablet Take 0.5 tablets (12.5 mg total) by mouth daily. 45 tablet 1  . vitamin B-12 (CYANOCOBALAMIN) 100 MCG tablet Take 100 mcg by mouth daily.    . vitamin E 1000 UNIT capsule Take 1,000 Units by mouth daily.     No current facility-administered medications on file prior to visit.   Past Medical History:  Diagnosis Date  . Arthritis   . GERD (gastroesophageal reflux disease)   . Hypertension   . Tuberculosis    History reviewed. No pertinent surgical history.  No Known Allergies  Family History  Problem Relation Age of Onset  . Hypertension Mother   . Cancer Sister   . Cancer Brother   . Cancer Brother     Social History   Socioeconomic History  . Marital status: Married    Spouse name: Not on file  . Number of children: Not on file  . Years of education: Not on file  . Highest education level: Not on file  Occupational History  . Occupation: Glass blower/designer  Tobacco Use  . Smoking status: Never Smoker  . Smokeless tobacco: Never Used  Substance and Sexual Activity  . Alcohol use: Yes    Alcohol/week: 5.0 standard drinks    Types: 5 Cans of beer per week  . Drug use: No  . Sexual activity: Yes  Other Topics Concern  . Not on file  Social History Narrative  . Not on file   Social Determinants of Health   Financial Resource Strain:   . Difficulty of Paying Living Expenses:   Food Insecurity:   . Worried About Charity fundraiser in the Last Year:   . Arboriculturist in the Last Year:   Transportation Needs:   . Film/video editor (Medical):   Marland Kitchen Lack of Transportation (Non-Medical):   Physical Activity:   . Days of Exercise per Week:   . Minutes of Exercise per Session:   Stress:   . Feeling of Stress :   Social Connections:   . Frequency of Communication with Friends and Family:   . Frequency of Social Gatherings with Friends and Family:   . Attends Religious  Services:   . Active Member of Clubs or Organizations:   . Attends Archivist Meetings:   Marland Kitchen Marital Status:     Vitals:   09/16/19 0819  BP: 124/78  Pulse: 79  Resp: 12  Temp: (!) 97.5 F (36.4 C)  SpO2: 98%   Body mass index is 30.6 kg/m.  Wt Readings from Last 3 Encounters:  09/16/19 195 lb 6 oz (88.6 kg)  03/12/19 200 lb 6 oz (90.9 kg)  12/10/18 200 lb 6 oz (90.9 kg)    Physical Exam Vitals and nursing note reviewed.  Constitutional:      General: He is not in acute distress.    Appearance: He is well-developed.  HENT:     Head: Atraumatic.     Right Ear: Tympanic membrane, ear canal and external ear normal.     Left Ear: Tympanic membrane, ear canal and external  ear normal.     Mouth/Throat:     Mouth: Oropharynx is clear and moist and mucous membranes are normal.  Eyes:     Extraocular Movements: EOM normal.     Conjunctiva/sclera: Conjunctivae normal.     Pupils: Pupils are equal, round, and reactive to light.  Neck:     Thyroid: No thyromegaly.     Trachea: No tracheal deviation.  Cardiovascular:     Rate and Rhythm: Normal rate and regular rhythm.     Pulses:          Dorsalis pedis pulses are 2+ on the right side and 2+ on the left side.     Heart sounds: No murmur heard.   Pulmonary:     Effort: Pulmonary effort is normal. No respiratory distress.     Breath sounds: Normal breath sounds.  Abdominal:     Palpations: Abdomen is soft. There is no hepatomegaly or mass.     Tenderness: There is no abdominal tenderness.  Genitourinary:    Comments: No concerns. Musculoskeletal:        General: No tenderness or edema.     Cervical back: Normal range of motion.     Comments: No major deformities appreciated and no signs of synovitis.  Lymphadenopathy:     Cervical: No cervical adenopathy.     Upper Body:     Right upper body: No supraclavicular adenopathy.     Left upper body: No supraclavicular adenopathy.  Skin:    General: Skin is warm.      Findings: No erythema or rash.     Comments: Left great toe nail with linear hyperpigmented area and subungual  rounded lesion , regular borders.Small area with  Pigmentation changes ,irregular area right toenail. See pictures.  Neurological:     Mental Status: He is alert and oriented to person, place, and time.     Cranial Nerves: No cranial nerve deficit.     Sensory: No sensory deficit.     Coordination: Coordination normal.     Gait: Gait normal.     Deep Tendon Reflexes: Strength normal.     Reflex Scores:      Bicep reflexes are 2+ on the right side and 2+ on the left side.      Patellar reflexes are 2+ on the right side and 2+ on the left side. Psychiatric:        Mood and Affect: Mood and affect normal.        Cognition and Memory: Cognition and memory normal.     Comments: Well groomed, good eye contact.             ASSESSMENT AND PLAN:  [name] was seen today for annual exam.  Diagnoses and all orders for this visit: Orders Placed This Encounter  Procedures  . Varicella-zoster vaccine IM (Shingrix)  . Comprehensive metabolic panel  . Hemoglobin A1c  . Lipid panel  . Urinalysis, Routine w reflex microscopic  . Vitamin B12   Lab Results  Component Value Date   [id number] 503 09/16/2019   Lab Results  Component Value Date   CHOL 207 (H) 09/16/2019   HDL 40.00 09/16/2019   LDLCALC 135 (H) 09/16/2019   LDLDIRECT 157.6 06/25/2007   TRIG 157.0 (H) 09/16/2019   CHOLHDL 5 09/16/2019   Lab Results  Component Value Date   HGBA1C 5.7 09/16/2019   Lab Results  Component Value Date   CREATININE 1.11 09/16/2019   BUN 20 09/16/2019   NA  135 09/16/2019   K 4.0 09/16/2019   CL 102 09/16/2019   CO2 28 09/16/2019   Lab Results  Component Value Date   ALT 24 09/16/2019   AST 25 09/16/2019   ALKPHOS 61 09/16/2019   BILITOT 0.5 09/16/2019    Routine general medical examination at a health care facility We discussed the importance of regular  physical activity and healthy diet for prevention of chronic illness and/or complications. Preventive guidelines reviewed. Vaccination updated.  Next CPE in a year.  The 10-year ASCVD risk score Mikey Bussing DC Brooke Bonito., et al., 2013) is: 13.2%   Values used to calculate the score:     Age: 65 years     Sex: Male     Is Non-Hispanic African American: No     Diabetic: No     Tobacco smoker: No     Systolic Blood Pressure: 161 mmHg     Is BP treated: Yes     HDL Cholesterol: 40 mg/dL     Total Cholesterol: 207 mg/dL  Screening for endocrine, metabolic and immunity disorder -     Comprehensive metabolic panel; Future -     Hemoglobin A1c; Future  Urinary frequency Problem could be related to BPH. Other possible etiologies discussed. If problem is persistent, recommend arranging appt with urologist. Further recommentations according to UA result.  Need for shingles vaccine -     Varicella-zoster vaccine IM (Shingrix)  B12 deficiency Continue vit B12 1000 mcg daily. Further recommendations according to B12 results.  Essential hypertension BP adequately controlled. No changes in current management. Continue low salt diet.  Hyperlipidemia Continue non pharmacologic treatment. Further recommendations according to lipid panel numbers and 10 years CVD risk score.  Nail discoloration Horizontal linear pyperpigmentation most likely benign. Rounded subungual lesion also seems benign, ? Trauma. Other more serious process need to be considered. Options: Derma referral or follow up in 6 months, he prefers the latter one. Instructed about warning signs.  Return in 6 months (on 03/17/2020) for HTN,toenail.   Oyinkansola Truax G. Martinique, MD  [facility]. [place] office.  A few things to remember from today's visit:  At least 150 minutes of moderate exercise per week, daily brisk walking for 15-30 min is a good exercise option. Healthy diet low in saturated (animal) fats and sweets and  consisting of fresh fruits and vegetables, lean meats such as fish and white chicken and whole grains.  - Vaccines:  Tdap vaccine every 10 years.  Shingles vaccine recommended at age 73, could be given after 62 years of age but not sure about insurance coverage.  Pneumonia vaccines: Pneumovax at 59   -Screening recommendations for low/normal risk males:  Screening for diabetes at age 31 and every 3 years. Earlier screening if cardiovascular risk factors.   Lipid screening at 35 and every 3 years. Screening starts in younger males with cardiovascular risk factors. N/A  Colon cancer screening is now at age 63 but your insurance may not cover until age 41 .screening is recommended age 39.  Prostate cancer screening: some controversy, starts usually at 69: Rectal exam and PSA.  Aortic Abdominal Aneurism once between 7 and 80 years old if ever smoker.  Also recommended:  1. Dental visit- Brush and floss your teeth twice daily; visit your dentist twice a year. 2. Eye doctor- Get an eye exam at least every 2 years. 3. Helmet use- Always wear a helmet when riding a bicycle, motorcycle, rollerblading or skateboarding. 4. Safe sex- If you may  be exposed to sexually transmitted infections, use a condom. 5. Seat belts- Seat belts can save your live; always wear one. 6. Smoke/Carbon Monoxide detectors- These detectors need to be installed on the appropriate level of your home. Replace batteries at least once a year. 7. Skin cancer- When out in the sun please cover up and use sunscreen 15 SPF or higher. 8. Violence- If anyone is threatening or hurting you, please tell your healthcare provider.  9. Drink alcohol in moderation- Limit alcohol intake to one drink or less per day. Never drink and drive.  If you need refills please call your pharmacy. Do not use My Chart to request refills or for acute issues that need immediate attention.   Labs at [facility].  This can be done at [facility] at [facility] between 8 am and 5 pm: [place]. [phone].

## 2019-09-16 ENCOUNTER — Encounter: Payer: Self-pay | Admitting: Family Medicine

## 2019-09-16 ENCOUNTER — Ambulatory Visit (INDEPENDENT_AMBULATORY_CARE_PROVIDER_SITE_OTHER): Payer: No Typology Code available for payment source | Admitting: Family Medicine

## 2019-09-16 ENCOUNTER — Other Ambulatory Visit (INDEPENDENT_AMBULATORY_CARE_PROVIDER_SITE_OTHER): Payer: No Typology Code available for payment source

## 2019-09-16 VITALS — BP 124/78 | HR 79 | Temp 97.5°F | Resp 12 | Ht 67.0 in | Wt 195.4 lb

## 2019-09-16 DIAGNOSIS — Z1329 Encounter for screening for other suspected endocrine disorder: Secondary | ICD-10-CM

## 2019-09-16 DIAGNOSIS — E538 Deficiency of other specified B group vitamins: Secondary | ICD-10-CM

## 2019-09-16 DIAGNOSIS — E785 Hyperlipidemia, unspecified: Secondary | ICD-10-CM

## 2019-09-16 DIAGNOSIS — L608 Other nail disorders: Secondary | ICD-10-CM

## 2019-09-16 DIAGNOSIS — Z130 Encounter for screening for diseases of the blood and blood-forming organs and certain disorders involving the immune mechanism: Secondary | ICD-10-CM

## 2019-09-16 DIAGNOSIS — Z23 Encounter for immunization: Secondary | ICD-10-CM | POA: Diagnosis not present

## 2019-09-16 DIAGNOSIS — R350 Frequency of micturition: Secondary | ICD-10-CM

## 2019-09-16 DIAGNOSIS — Z13228 Encounter for screening for other metabolic disorders: Secondary | ICD-10-CM

## 2019-09-16 DIAGNOSIS — Z0000 Encounter for general adult medical examination without abnormal findings: Secondary | ICD-10-CM

## 2019-09-16 DIAGNOSIS — I10 Essential (primary) hypertension: Secondary | ICD-10-CM

## 2019-09-16 LAB — COMPREHENSIVE METABOLIC PANEL
ALT: 24 U/L (ref 0–53)
AST: 25 U/L (ref 0–37)
Albumin: 4.4 g/dL (ref 3.5–5.2)
Alkaline Phosphatase: 61 U/L (ref 39–117)
BUN: 20 mg/dL (ref 6–23)
CO2: 28 mEq/L (ref 19–32)
Calcium: 9.6 mg/dL (ref 8.4–10.5)
Chloride: 102 mEq/L (ref 96–112)
Creatinine, Ser: 1.11 mg/dL (ref 0.40–1.50)
GFR: 67.07 mL/min (ref >60.00)
Glucose, Bld: 103 mg/dL — ABNORMAL HIGH (ref 70–99)
Potassium: 4 mEq/L (ref 3.5–5.1)
Sodium: 135 mEq/L (ref 135–145)
Total Bilirubin: 0.5 mg/dL (ref 0.2–1.2)
Total Protein: 8.2 g/dL (ref 6.0–8.3)

## 2019-09-16 LAB — URINALYSIS, ROUTINE W REFLEX MICROSCOPIC
Bilirubin Urine: NEGATIVE
Hgb urine dipstick: NEGATIVE
Ketones, ur: NEGATIVE
Leukocytes,Ua: NEGATIVE
Nitrite: NEGATIVE
RBC / HPF: NONE SEEN (ref 0–?)
Specific Gravity, Urine: 1.015 (ref 1.000–1.030)
Total Protein, Urine: NEGATIVE
Urine Glucose: NEGATIVE
Urobilinogen, UA: 0.2 (ref 0.0–1.0)
WBC, UA: NONE SEEN (ref 0–?)
pH: 7 (ref 5.0–8.0)

## 2019-09-16 LAB — VITAMIN B12: Vitamin B-12: 565 pg/mL (ref 211–911)

## 2019-09-16 LAB — LIPID PANEL
Cholesterol: 207 mg/dL — ABNORMAL HIGH (ref 0–200)
HDL: 40 mg/dL (ref >39.00)
LDL Cholesterol: 135 mg/dL — ABNORMAL HIGH (ref 0–99)
NonHDL: 166.56
Total CHOL/HDL Ratio: 5
Triglycerides: 157 mg/dL — ABNORMAL HIGH (ref 0.0–149.0)
VLDL: 31.4 mg/dL (ref 0.0–40.0)

## 2019-09-16 LAB — HEMOGLOBIN A1C: Hgb A1c MFr Bld: 5.7 % (ref 4.6–6.5)

## 2019-09-16 NOTE — Assessment & Plan Note (Signed)
BP adequately controlled. No changes in current management. Continue low salt diet. 

## 2019-09-16 NOTE — Assessment & Plan Note (Signed)
Continue non pharmacologic treatment. Further recommendations according to lipid panel numbers and 10 years CVD risk score.

## 2019-09-16 NOTE — Assessment & Plan Note (Addendum)
Continue vit B12 1000 mcg daily. Further recommendations according to B12 results.

## 2019-09-16 NOTE — Patient Instructions (Addendum)
A few things to remember from today's visit:   B12 deficiency  Essential hypertension  Hyperlipidemia, unspecified hyperlipidemia type   At least 150 minutes of moderate exercise per week, daily brisk walking for 15-30 min is a good exercise option. Healthy diet low in saturated (animal) fats and sweets and consisting of fresh fruits and vegetables, lean meats such as fish and white chicken and whole grains.  - Vaccines:  Tdap vaccine every 10 years.  Shingles vaccine recommended at age 40, could be given after 62 years of age but not sure about insurance coverage.  Pneumonia vaccines: Pneumovax at 16   -Screening recommendations for low/normal risk males:  Screening for diabetes at age 45 and every 3 years. Earlier screening if cardiovascular risk factors.   Lipid screening at 35 and every 3 years. Screening starts in younger males with cardiovascular risk factors. N/A  Colon cancer screening is now at age 65 but your insurance may not cover until age 85 .screening is recommended age 110.  Prostate cancer screening: some controversy, starts usually at 49: Rectal exam and PSA.  Aortic Abdominal Aneurism once between 38 and 72 years old if ever smoker.  Also recommended:  1. Dental visit- Brush and floss your teeth twice daily; visit your dentist twice a year. 2. Eye doctor- Get an eye exam at least every 2 years. 3. Helmet use- Always wear a helmet when riding a bicycle, motorcycle, rollerblading or skateboarding. 4. Safe sex- If you may be exposed to sexually transmitted infections, use a condom. 5. Seat belts- Seat belts can save your live; always wear one. 6. Smoke/Carbon Monoxide detectors- These detectors need to be installed on the appropriate level of your home. Replace batteries at least once a year. 7. Skin cancer- When out in the sun please cover up and use sunscreen 15 SPF or higher. 8. Violence- If anyone is threatening or hurting you, please tell your  healthcare provider.  9. Drink alcohol in moderation- Limit alcohol intake to one drink or less per day. Never drink and drive.  If you need refills please call your pharmacy. Do not use My Chart to request refills or for acute issues that need immediate attention.   Labs at [facility].  This can be done at [facility] at [facility] between 8 am and 5 pm: [place]. [phone].

## 2019-09-19 ENCOUNTER — Other Ambulatory Visit: Payer: Self-pay | Admitting: *Deleted

## 2019-09-19 DIAGNOSIS — E785 Hyperlipidemia, unspecified: Secondary | ICD-10-CM

## 2019-09-19 MED ORDER — ATORVASTATIN CALCIUM 20 MG PO TABS: 20.0000 mg | ORAL_TABLET | Freq: Every day | ORAL | 3 refills | Status: DC

## 2019-09-22 ENCOUNTER — Telehealth: Payer: Self-pay | Admitting: Family Medicine

## 2019-09-22 ENCOUNTER — Other Ambulatory Visit: Payer: Self-pay | Admitting: *Deleted

## 2019-09-22 DIAGNOSIS — Z0100 Encounter for examination of eyes and vision without abnormal findings: Secondary | ICD-10-CM

## 2019-09-22 NOTE — Telephone Encounter (Signed)
Referral placed as requested.

## 2019-09-22 NOTE — Telephone Encounter (Signed)
Pt is requesting a referral to Dr. Dorinda Hill, [facility].  Pt had an eye exam 4 months ago. Pt has an appt today at 10:15 am and they are needing a referral to be able to see him today. Thanks  [facility]'s [phone]

## 2019-10-17 MED FILL — AMLODIPINE BESYLATE 5 MG TA: 5 | 90 days supply | Qty: 90 | Fill #2

## 2019-10-17 MED FILL — LISINOPRIL-HCTZ 20-12.5 MG: 20-12.5 | 90 days supply | Qty: 180 | Fill #2

## 2019-10-24 MED FILL — OMEPRAZOLE 20 MG CAP: 20 | 90 days supply | Qty: 90 | Fill #1

## 2019-10-28 ENCOUNTER — Ambulatory Visit: Payer: No Typology Code available for payment source

## 2019-11-17 ENCOUNTER — Other Ambulatory Visit: Payer: Self-pay

## 2019-11-17 ENCOUNTER — Ambulatory Visit (INDEPENDENT_AMBULATORY_CARE_PROVIDER_SITE_OTHER): Payer: No Typology Code available for payment source | Admitting: *Deleted

## 2019-11-17 DIAGNOSIS — Z23 Encounter for immunization: Secondary | ICD-10-CM | POA: Diagnosis not present

## 2019-11-17 NOTE — Progress Notes (Signed)
Per orders of Dr. Martinique, injection of 2nd  Shingles vaccine given by Zacarias Pontes. Patient tolerated injection well.

## 2019-12-25 MED FILL — ATORVASTATIN CALCIUM 20 MG: 20 | 90 days supply | Qty: 90 | Fill #1

## 2019-12-25 MED FILL — TAMSULOSIN HCL 0.4 MG CAP: 0.4 | 90 days supply | Qty: 90 | Fill #2

## 2020-01-16 ENCOUNTER — Other Ambulatory Visit: Payer: Self-pay | Admitting: Family Medicine

## 2020-01-16 DIAGNOSIS — I10 Essential (primary) hypertension: Secondary | ICD-10-CM

## 2020-01-18 ENCOUNTER — Other Ambulatory Visit: Payer: Self-pay | Admitting: Family Medicine

## 2020-01-19 ENCOUNTER — Other Ambulatory Visit: Payer: Self-pay | Admitting: Family Medicine

## 2020-01-19 MED FILL — LISINOPRIL-HCTZ 20-12.5 MG: 20-12.5 | 90 days supply | Qty: 180 | Fill #0

## 2020-01-19 MED FILL — AMLODIPINE BESYLATE 5 MG TA: 5 | 90 days supply | Qty: 90 | Fill #0

## 2020-01-19 MED FILL — SPIRONOLACTONE 25 MG TABS: 25 | 90 days supply | Qty: 45 | Fill #0

## 2020-01-19 NOTE — Telephone Encounter (Signed)
Pt is calling in stating that he is out Rx spironolactone (ALDATONE) 25 MG and lidocaine (LIDODERM) 5%  Pharm: [place].

## 2020-03-17 ENCOUNTER — Encounter: Payer: Self-pay | Admitting: Family Medicine

## 2020-03-17 ENCOUNTER — Other Ambulatory Visit: Payer: Self-pay

## 2020-03-17 ENCOUNTER — Ambulatory Visit (INDEPENDENT_AMBULATORY_CARE_PROVIDER_SITE_OTHER): Payer: No Typology Code available for payment source | Admitting: Family Medicine

## 2020-03-17 ENCOUNTER — Other Ambulatory Visit: Payer: Self-pay | Admitting: Family Medicine

## 2020-03-17 VITALS — BP 124/82 | HR 81 | Temp 98.1°F | Resp 16 | Ht 67.0 in | Wt 208.0 lb

## 2020-03-17 DIAGNOSIS — M2550 Pain in unspecified joint: Secondary | ICD-10-CM

## 2020-03-17 DIAGNOSIS — E785 Hyperlipidemia, unspecified: Secondary | ICD-10-CM

## 2020-03-17 DIAGNOSIS — I10 Essential (primary) hypertension: Secondary | ICD-10-CM

## 2020-03-17 DIAGNOSIS — R1011 Right upper quadrant pain: Secondary | ICD-10-CM

## 2020-03-17 DIAGNOSIS — M5441 Lumbago with sciatica, right side: Secondary | ICD-10-CM

## 2020-03-17 DIAGNOSIS — Z23 Encounter for immunization: Secondary | ICD-10-CM | POA: Diagnosis not present

## 2020-03-17 DIAGNOSIS — G8929 Other chronic pain: Secondary | ICD-10-CM

## 2020-03-17 MED ORDER — LIDOCAINE 5 % EX PTCH: 1.0000 | MEDICATED_PATCH | CUTANEOUS | 1 refills | Status: DC

## 2020-03-17 NOTE — Assessment & Plan Note (Signed)
BP adequately controlled. Continue Spironolactone 25 mg 1/2 tab daily,Amlodipine 5 mg daily,and Lisinopril-HCTZ 20-12.5 mg 2 tabs daily. Low salt diet. Continue monitoing BP's at home.

## 2020-03-17 NOTE — Assessment & Plan Note (Signed)
Continue Atorvastatin 20 mg daily. Low fat diet also recommended. Further recommendations according to lipid panel results.

## 2020-03-17 NOTE — Patient Instructions (Addendum)
A few things to remember from today's visit: Orders Placed This Encounter  Procedures  . CT Abdomen Pelvis W Contrast  . Lipid panel  . COMPLETE METABOLIC PANEL WITH GFR  . Sedimentation rate  . C-reactive protein  . Rheumatoid factor  . Cyclic citrul peptide antibody, IgG    If you need refills please call your pharmacy. Do not use My Chart to request refills or for acute issues that need immediate attention.   No changes today.  Artrosis Osteoarthritis  La artrosis es un tipo de artritis que afecta el tejido que cubre los extremos de los huesos en las articulaciones (cartlago). El cartlago acta como amortiguador [employer] y los ayuda a moverse con suavidad. La artrosis se produce cuando el cartlago de las articulaciones se gasta. A veces, la artrosis se denomina artritis "por uso y desgaste". La artrosis es la forma ms frecuente de artritis. A menudo, afecta a las [employer]. Es una enfermedad que empeora con el tiempo (una enfermedad progresiva). Esta enfermedad afecta con ms frecuencia las articulaciones de:  Los dedos de [employer].  Los dedos [employer]ogg.  La cadera.  Las rodillas.  La columna vertebral, incluido el cuello y la zona lumbar. Cules son las causas? El desgaste del cartlago que cubre los extremos de los huesos relacionado con la edad, causa esta afeccin. Qu incrementa el riesgo? Los siguientes factores pueden hacer que usted sea ms propenso a Best boy esta afeccin:  Edad avanzada.  Tener exceso de [place] u obesidad.  Uso excesivo de las articulaciones, como en el caso de los [place].  Lesin pasada de Insurance claims handler.  Ciruga pasada en una articulacin.  Antecedentes familiares de artrosis. Cules son los signos o los sntomas? Los principales sntomas de esta enfermedad son dolor, hinchazn y Advertising account executive. Con el tiempo, la articulacin puede perder su forma. Pequeos trozos de [employer] o Scientist, physiological y flotar dentro de la articulacin, lo cual puede causar ms dolor y Agricultural consultant. Pueden formarse pequeos depsitos de hueso (ostefitos) en los extremos de Water engineer. Otros sntomas pueden incluir lo siguiente:  Una sensacin de chirrido o raspado dentro de la articulacin al moverla.  Sonidos de chasquido o crujido al [employer]. Los sntomas pueden afectar una o ms articulaciones. La artrosis en una articulacin principal, como la rodilla o la cadera, puede causar dolor al caminar o al realizar ejercicio. Si tiene artrosis [employer], es posible que no pueda agarrar objetos, torcer la mano o controlar pequeos movimientos de las manos y los dedos (motricidad fina). Cmo se diagnostica? Esta afeccin se puede diagnosticar en funcin de lo siguiente:  Sus antecedentes mdicos.  Un examen fsico.  Sus sntomas.  Radiografas de la(s) articulacin(es) afectada(s).  Anlisis de sangre para descartar otros tipos de artritis. Cmo se trata? No hay cura para esta enfermedad, pero el tratamiento puede ayudar a Financial controller y Teacher, English as a foreign language el funcionamiento de Water engineer. Los planes de tratamiento pueden incluir:  Un programa de ejercicio indicado que permita el descanso y el alivio de la articulacin. Puede trabajar con un fisioterapeuta.  Un plan de control del peso.  Tcnicas de [employer], como: ? Aplicacin de calor y fro en la articulacin. ? Impulsos elctricos aplicados a las terminaciones nerviosas que se encuentran debajo de la piel (estimulacin nerviosa elctrica transcutnea o TENS). ? Masajes. ? Ciertos suplementos nutricionales.  Antiinflamatorios no esteroideos (AINE) o medicamentos recetados para ayudar a Public house manager  el dolor.  Medicamentos para ayudar a Best boy y la inflamacin (corticoesteroides). Estos se pueden administrar por boca (va oral) o mediante una inyeccin.  Dispositivos de [place], como un dispositivo  ortopdico, una frula, un guante especial o un bastn.  Ciruga, como: ? Imelda Pillow. Se hace para volver a posicionar los [employer] y Best boy o para Charity fundraiser los trozos sueltos de hueso y Database administrator. ? Ciruga de reemplazo articular. Es posible que necesite esta ciruga si tiene una artrosis muy grave (avanzada). Siga estas indicaciones en su casa: [place] articulaciones afectadas segn las indicaciones del mdico.  No conduzca ni use maquinaria pesada mientras toma analgsicos recetados.  Haga ejercicio segn le indiquen. Es posible que el mdico o el fisioterapeuta le recomienden tipos especficos de ejercicio, tales como: ? Ejercicios de fortalecimiento. Se realizan para fortalecer los msculos que sostienen las articulaciones afectadas por la artritis. Pueden realizarse con peso o con bandas para agregar resistencia. ? Ejercicios aerbicos. Son ejercicios, como caminar a paso ligero o hacer gimnasia [place] acutica, que aumentan la actividad del corazn. ? Actividades de amplitud de movimientos. Rosita articulaciones. ? Ejercicios de equilibrio y [place]. Control del dolor, la rigidez y la hinchazn      Si se lo indican, aplique calor en la zona afectada con la frecuencia que le haya indicado el mdico. Use la fuente de calor que el mdico le recomiende, como una compresa de calor hmedo o una almohadilla trmica. ? Si tiene un dispositivo de ayuda que se puede quitar, quteselo segn lo indicado por su mdico. ? Coloque una toalla entre la piel y la fuente de Freight forwarder. Si el mdico le indica que no se quite el dispositivo de [employer] se Passenger transport manager, coloque una toalla entre el dispositivo de [place] y la fuente de Freight forwarder. ? Aplique el calor durante 20 a 19minutos. ? Retire la fuente de calor si la piel se pone de color rojo brillante. Esto es muy importante si no puede Education officer, environmental, calor o fro. Puede correr un riesgo mayor de  sufrir quemaduras.  Si se lo indican, aplique hielo sobre la articulacin afectada: ? Si tiene un dispositivo de ayuda que se puede quitar, quteselo segn lo indicado por su mdico. ? Ponga el hielo en una bolsa plstica. ? Coloque una [employer] piel y la bolsa de hielo. Si el mdico le indica que no se quite el dispositivo de [employer] se aplica hielo, coloque una toalla entre el dispositivo de [place] y la bolsa de hielo. ? Coloque el hielo durante 25minutos, 2 a 3veces por da. Instrucciones generales  [employer] de venta libre y los recetados solamente como se lo haya indicado el mdico.  Mantenga un peso saludable. Siga las instrucciones de su mdico con respecto al control de su peso. Estas pueden incluir restricciones en la dieta.  No consuma ningn producto que contenga nicotina o tabaco, como cigarrillos y Psychologist, sport and exercise. Estos pueden retrasar la consolidacin del [place]. Si necesita ayuda para dejar de fumar, consulte al [employer].  Use los dispositivos de [employer] se lo haya indicado el mdico.  Concurra a todas las visitas de seguimiento como se lo haya indicado el mdico. Esto es importante. Dnde encontrar ms informacin:  Air traffic controller de Reumatismo Articular y Arboriculturist Musculoesquelticas y Insurance underwriter [facility] of Arthritis and Musculoskeletal and Skin Diseases): www.niams.SouthExposed.es  Victor [employer] on Aging): http://kim-miller.com/  Instit[facility] de  Reumatologa Lexicographer of Rheumatology): www.rheumatology.org Comunquese con un mdico si:  Su piel se pone roja.  Presenta una erupcin cutnea.  Siente un dolor que [place].  Tiene fiebre y siente dolor en la articulacin o el msculo. Solicite ayuda de inmediato si:  [employer].  Pierde el apetito repentinamente.  Transpira durante la noche. Resumen  La artrosis es una clase de artritis que  afecta el tejido que cubre los extremos de los huesos en las articulaciones (cartlago).  El desgaste del cartlago que cubre los extremos de los huesos relacionado con la edad, causa esta afeccin.  Los sntomas principales de esta enfermedad son dolor, hinchazn y Advertising account executive.  No hay cura para esta enfermedad, pero el tratamiento puede ayudar a Financial controller y Teacher, English as a foreign language el funcionamiento de Water engineer. Esta informacin no tiene Marine scientist el consejo del mdico. Asegrese de hacerle al mdico cualquier pregunta que tenga. Document Revised: 06/25/2017 Document Reviewed: 12/02/2012 Elsevier Patient Education  [place].  Please be sure medication list is accurate. If a new problem present, please set up appointment sooner than planned today.

## 2020-03-17 NOTE — Assessment & Plan Note (Signed)
Problem is stable. Continue Lidocaine patch daily as needed.

## 2020-03-17 NOTE — Progress Notes (Signed)
HPI: AaronLark Bowman is a 64 y.o. male, who is here today for 6 months follow up.   He was last seen on 09/16/19. HTN: Dx'ed in 1998. He is on Spironolactone 25 mg 1/2 tab daily,Amlodipine 5 mg daily,and Lisinopril-HCTZ 20-12.5 mg 2 tabs daily.  Negative for severe/frequent headache, visual changes, chest pain, dyspnea, palpitation, claudication, focal weakness, or worsening edema.  Component     Latest Ref Rng & Units 09/16/2019  Sodium     135 - 146 mmol/L 135  Potassium     3.5 - 5.3 mmol/L 4.0  Chloride     98 - 110 mmol/L 102  CO2     20 - 32 mmol/L 28  Glucose     65 - 99 mg/dL 103 (H)  BUN     7 - 25 mg/dL 20  Creatinine     0.70 - 1.25 mg/dL 1.11  Total Bilirubin     0.2 - 1.2 mg/dL 0.5  Alkaline Phosphatase     39 - 117 U/L 61  AST     10 - 35 U/L 25  ALT     9 - 46 U/L 24  Total Protein     6.1 - 8.1 g/dL 8.2  Albumin     3.5 - 5.2 g/dL 4.4  GFR     >60.00 mL/min 67.07  Calcium     8.6 - 10.3 mg/dL 9.6   HLD: He is on Atorvastatin 20 mg daily.Medication was started last visit. He has tolerated medication well. He has not been consistent with following a healthful diet.  Lab Results  Component Value Date   CHOL 207 (H) 09/16/2019   HDL 40.00 09/16/2019   LDLCALC 135 (H) 09/16/2019   LDLDIRECT 157.6 06/25/2007   TRIG 157.0 (H) 09/16/2019   CHOLHDL 5 09/16/2019   He has not been exercising regularly. While in [place] he was eating bread and toasts at night.  PIP joint 5th finger,bilateral. About 30+ years he was hospitalized because he was "paralized." Extensive work-up was done. States that he was Dx'ed with RA a few years ago, 67's. He has not had symptoms for years until recently when weather started getting colder. +Hand cramps. Negative for fever,fatigue,oral lesions,or skin rash. He is not taking OTC treatments.  Knee pain when he plays soccer, it is "bad." He has not noted edema or erythema.  RUQ pain, cramp. Pain is present  every morning when he wakes up. Exacerbated by certain movements. No associated N/V,changes in bowel habits,numbness,or tingling. Occasionally RLQ pain. Problem has been going on since 05/2018.  Pain has been stable otherwise.  RUQ US done on 08/29/18: Negative for gallstones or acute finding by ultrasound. Limited as above  Low back pain radiated to RLE,intermittent for about a year. Right thigh and foot burning like sensation. Epidural injection has been recommended but not done due to cost. Negative for saddle anesthesia or bladder/bowel dysfunction. Lidocaine patch helps.  Review of Systems  Constitutional: Negative for activity change, appetite change and fatigue.  HENT: Negative for mouth sores, nosebleeds and sore throat.   Respiratory: Negative for cough and wheezing.   Endocrine: Negative for cold intolerance and heat intolerance.  Genitourinary: Negative for decreased urine volume, dysuria and hematuria.  Musculoskeletal: Positive for arthralgias. Negative for gait problem.  Neurological: Negative for syncope, facial asymmetry and weakness.  Hematological: Negative for adenopathy. Does not bruise/bleed easily.  Rest of ROS, see pertinent positives sand negatives in HPI  Current Outpatient Medications  on File Prior to Visit  Medication Sig Dispense Refill  . amLODipine (NORVASC) 5 MG tablet TAKE 1 TABLET (5 MG TOTAL) BY MOUTH DAILY. 90 tablet 2  . aspirin EC 81 MG tablet Take by mouth.    Marland Kitchen atorvastatin (LIPITOR) 20 MG tablet Take 1 tablet (20 mg total) by mouth daily. 90 tablet 3  . cholecalciferol (VITAMIN D) 400 units TABS tablet Take 400 Units by mouth daily.    . ferrous sulfate 325 (65 FE) MG EC tablet Take 325 mg by mouth 1 day or 1 dose.    Marland Kitchen lisinopril-hydrochlorothiazide (ZESTORETIC) 20-12.5 MG tablet TAKE 2 TABLETS BY MOUTH DAILY. 180 tablet 2  . Omega-3 Fatty Acids (FISH OIL) 1000 MG CAPS Take 1,000 mg by mouth.    Marland Kitchen omeprazole (PRILOSEC) 20 MG capsule TAKE 1  CAPSULE BY MOUTH ONCE DAILY 90 capsule 1  . spironolactone (ALDACTONE) 25 MG tablet TAKE 1/2 TABLET (12.5 MG TOTAL) BY MOUTH DAILY. 45 tablet 1  . tamsulosin (FLOMAX) 0.4 MG CAPS capsule Take 0.4 mg by mouth at bedtime.    . vitamin B-12 (CYANOCOBALAMIN) 100 MCG tablet Take 100 mcg by mouth daily.    . vitamin E 1000 UNIT capsule Take 1,000 Units by mouth daily.     No current facility-administered medications on file prior to visit.   Past Medical History:  Diagnosis Date  . Arthritis   . GERD (gastroesophageal reflux disease)   . Hypertension   . Tuberculosis    No Known Allergies  Social History   Socioeconomic History  . Marital status: Married    Spouse name: Not on file  . Number of children: Not on file  . Years of education: Not on file  . Highest education level: Not on file  Occupational History  . Occupation: Glass blower/designer  Tobacco Use  . Smoking status: Never Smoker  . Smokeless tobacco: Never Used  Substance and Sexual Activity  . Alcohol use: Yes    Alcohol/week: 5.0 standard drinks    Types: 5 Cans of beer per week  . Drug use: No  . Sexual activity: Yes  Other Topics Concern  . Not on file  Social History Narrative  . Not on file   Social Determinants of Health   Financial Resource Strain: Not on file  Food Insecurity: Not on file  Transportation Needs: Not on file  Physical Activity: Not on file  Stress: Not on file  Social Connections: Not on file    Vitals:   03/17/20 1521  BP: 124/82  Pulse: 81  Resp: 16  Temp: 98.1 F (36.7 C)  SpO2: 97%   Wt Readings from Last 3 Encounters:  03/17/20 208 lb (94.3 kg)  09/16/19 195 lb 6 oz (88.6 kg)  03/12/19 200 lb 6 oz (90.9 kg)   Body mass index is 32.58 kg/m.  Physical Exam Vitals and nursing note reviewed.  Constitutional:      General: He is not in acute distress.    Appearance: He is well-developed.  HENT:     Head: Normocephalic and atraumatic.     Mouth/Throat:     Mouth:  Mucous membranes are moist.     Pharynx: Oropharynx is clear.  Eyes:     Conjunctiva/sclera: Conjunctivae normal.     Pupils: Pupils are equal, round, and reactive to light.  Cardiovascular:     Rate and Rhythm: Normal rate and regular rhythm.     Pulses:  Dorsalis pedis pulses are 2+ on the right side and 2+ on the left side.     Heart sounds: No murmur heard.   Pulmonary:     Effort: Pulmonary effort is normal. No respiratory distress.     Breath sounds: Normal breath sounds.  Abdominal:     Palpations: Abdomen is soft. There is no hepatomegaly or mass.     Tenderness: There is no abdominal tenderness.  Musculoskeletal:     Comments: No signs of synovitis.  Lymphadenopathy:     Cervical: No cervical adenopathy.  Skin:    General: Skin is warm.     Findings: No erythema or rash.  Neurological:     Mental Status: He is alert and oriented to person, place, and time.     Cranial Nerves: No cranial nerve deficit.     Gait: Gait normal.  Psychiatric:     Comments: Well groomed, good eye contact.   ASSESSMENT AND PLAN:   Mr. Cleaven Demario was seen today for 6 months follow-up.  Orders Placed This Encounter  Procedures  . CT Abdomen Pelvis W Contrast  . Flu Vaccine QUAD 36+ mos IM  . Lipid panel  . COMPLETE METABOLIC PANEL WITH GFR  . Sedimentation rate  . C-reactive protein  . Rheumatoid factor  . Cyclic citrul peptide antibody, IgG   Lab Results  Component Value Date   CRP 4.2 03/17/2020   Lab Results  Component Value Date   ESRSEDRATE 9 03/17/2020   Lab Results  Component Value Date   CHOL 162 03/17/2020   HDL 41 03/17/2020   LDLCALC 92 03/17/2020   LDLDIRECT 157.6 06/25/2007   TRIG 193 (H) 03/17/2020   CHOLHDL 4.0 03/17/2020   Lab Results  Component Value Date   CREATININE 1.06 03/17/2020   BUN 18 03/17/2020   NA 139 03/17/2020   K 4.0 03/17/2020   CL 101 03/17/2020   CO2 28 03/17/2020   Lab Results  Component Value Date   ALT 22  03/17/2020   AST 23 03/17/2020   ALKPHOS 61 09/16/2019   BILITOT 0.3 03/17/2020    Polyarthralgia We discussed possible etiologies, most likely OA. Rheumatologic work-up ordered today, further recommendations will be given accordingly.  Chronic RUQ pain ?Radicular,musculoskeletal. Abdominal CT will be arranged.  Need for influenza vaccination -     Flu Vaccine QUAD 36+ mos IM  Essential hypertension BP adequately controlled. Continue Spironolactone 25 mg 1/2 tab daily,Amlodipine 5 mg daily,and Lisinopril-HCTZ 20-12.5 mg 2 tabs daily. Low salt diet. Continue monitoing BP's at home.  Chronic low back pain with right-sided sciatica Problem is stable. Continue Lidocaine patch daily as needed.  Hyperlipidemia Continue Atorvastatin 20 mg daily. Low fat diet also recommended. Further recommendations according to lipid panel results.   Spent 40 minutes.  During this time history was obtained and documented, examination was performed, prior labs/imaging reviewed, and assessment/plan discussed.  Return in about 6 months (around 09/15/2020) for cpe.   Aaron Hickam G. Martinique, MD  [facility]. [place] office.   A few things to remember from today's visit: Orders Placed This Encounter  Procedures  . CT Abdomen Pelvis W Contrast  . Lipid panel  . COMPLETE METABOLIC PANEL WITH GFR  . Sedimentation rate  . C-reactive protein  . Rheumatoid factor  . Cyclic citrul peptide antibody, IgG    If you need refills please call your pharmacy. Do not use My Chart to request refills or for acute issues that need immediate attention.  No changes today.  Artrosis Osteoarthritis  La artrosis es un tipo de artritis que afecta el tejido que cubre los extremos de los huesos en las articulaciones (cartlago). El cartlago acta como amortiguador Monsanto Company y los ayuda a moverse con suavidad. La artrosis se produce cuando el cartlago de las articulaciones se gasta. A veces, la  artrosis se denomina artritis "por uso y desgaste". La artrosis es la forma ms frecuente de artritis. A menudo, afecta a las [employer] First American. Es una enfermedad que empeora con el tiempo (una enfermedad progresiva). Esta enfermedad afecta con ms frecuencia las articulaciones de:  Los dedos de Mar[employer]ott.  Los dedos Kellogg.  La cadera.  Las rodillas.  La columna vertebral, incluido el cuello y la zona lumbar. Cules son las causas? El desgaste del cartlago que cubre los extremos de los huesos relacionado con la edad, causa esta afeccin. Qu incrementa el riesgo? Los siguientes factores pueden hacer que usted sea ms propenso a Best boy esta afeccin:  Edad avanzada.  Tener exceso de [place] u obesidad.  Uso excesivo de las articulaciones, como en el caso de los [place].  Lesin pasada de Insurance claims handler.  Ciruga pasada en una articulacin.  Antecedentes familiares de artrosis. Cules son los signos o los sntomas? Los principales sntomas de esta enfermedad son dolor, hinchazn y Advertising account executive. Con el tiempo, la articulacin puede perder su forma. Pequeos trozos de Praxair o Biomedical scientist y flotar dentro de la articulacin, lo cual puede causar ms dolor y Agricultural consultant. Pueden formarse pequeos depsitos de hueso (ostefitos) en los extremos de Water engineer. Otros sntomas pueden incluir lo siguiente:  Una sensacin de chirrido o raspado dentro de la articulacin al moverla.  Sonidos de chasquido o crujido al [employer]. Los sntomas pueden afectar una o ms articulaciones. La artrosis en una articulacin principal, como la rodilla o la cadera, puede causar dolor al caminar o al realizar ejercicio. Si tiene artrosis IAC/InterActiveCorp, es posible que no pueda agarrar objetos, torcer la mano o controlar pequeos movimientos de las manos y los dedos (motricidad fina). Cmo se diagnostica? Esta afeccin se puede diagnosticar en funcin de lo  siguiente:  Sus antecedentes mdicos.  Un examen fsico.  Sus sntomas.  Radiografas de la(s) articulacin(es) afectada(s).  Anlisis de sangre para descartar otros tipos de artritis. Cmo se trata? No hay cura para esta enfermedad, pero el tratamiento puede ayudar a Financial controller y Teacher, English as a foreign language el funcionamiento de Water engineer. Los planes de tratamiento pueden incluir:  Un programa de ejercicio indicado que permita el descanso y el alivio de la articulacin. Puede trabajar con un fisioterapeuta.  Un plan de control del peso.  Tcnicas de UnumProvident, como: ? Aplicacin de calor y fro en la articulacin. ? Impulsos elctricos aplicados a las terminaciones nerviosas que se encuentran debajo de la piel (estimulacin nerviosa elctrica transcutnea o TENS). ? Masajes. ? Ciertos suplementos nutricionales.  Antiinflamatorios no esteroideos (AINE) o medicamentos recetados para ayudar a Best boy.  Medicamentos para ayudar a Best boy y la inflamacin (corticoesteroides). Estos se pueden administrar por boca (va oral) o mediante una inyeccin.  Dispositivos de [place], como un dispositivo ortopdico, una frula, un guante especial o un bastn.  Ciruga, como: ? Imelda Pillow. Se hace para volver a posicionar los Affiliated Computer Services y Best boy o para Charity fundraiser los trozos sueltos de hueso y Database administrator. ? Ciruga de reemplazo articular. Es posible que necesite  esta ciruga si tiene una artrosis muy grave (avanzada). Siga estas indicaciones en su casa: [place] articulaciones afectadas segn las indicaciones del mdico.  No conduzca ni use maquinaria pesada mientras toma analgsicos recetados.  Haga ejercicio segn le indiquen. Es posible que el mdico o el fisioterapeuta le recomienden tipos especficos de ejercicio, tales como: ? Ejercicios de fortalecimiento. Se realizan para fortalecer los msculos que sostienen las articulaciones afectadas por la  artritis. Pueden realizarse con peso o con bandas para agregar resistencia. ? Ejercicios aerbicos. Son ejercicios, como caminar a paso ligero o hacer gimnasia [place] acutica, que aumentan la actividad del corazn. ? Actividades de amplitud de movimientos. [place] articulaciones. ? Ejercicios de equilibrio y [place]. Control del dolor, la rigidez y la hinchazn      Si se lo indican, aplique calor en la zona afectada con la frecuencia que le haya indicado el mdico. Use la fuente de calor que el mdico le recomiende, como una compresa de calor hmedo o una almohadilla trmica. ? Si tiene un dispositivo de ayuda que se puede quitar, quteselo segn lo indicado por su mdico. ? Coloque una toalla entre la piel y la fuente de Freight forwarder. Si el mdico le indica que no se quite el dispositivo de HCA Inc se Passenger transport manager, coloque una toalla entre el dispositivo de [place] y la fuente de Freight forwarder. ? Aplique el calor durante 20 a 68minutos. ? Retire la fuente de calor si la piel se pone de color rojo brillante. Esto es muy importante si no puede Education officer, environmental, calor o fro. Puede correr un riesgo mayor de sufrir quemaduras.  Si se lo indican, aplique hielo sobre la articulacin afectada: ? Si tiene un dispositivo de ayuda que se puede quitar, quteselo segn lo indicado por su mdico. ? Ponga el hielo en una bolsa plstica. ? Coloque una [employer] piel y la bolsa de hielo. Si el mdico le indica que no se quite el dispositivo de HCA Inc se aplica hielo, coloque una toalla entre el dispositivo de [place] y la bolsa de hielo. ? Coloque el hielo durante 42minutos, 2 a 3veces por da. Instrucciones generales  Delphi de venta libre y los recetados solamente como se lo haya indicado el mdico.  Mantenga un peso saludable. Siga las instrucciones de su mdico con respecto al control de su peso. Estas pueden incluir restricciones en la dieta.  No consuma  ningn producto que contenga nicotina o tabaco, como cigarrillos y Psychologist, sport and exercise. Estos pueden retrasar la consolidacin del [place]. Si necesita ayuda para dejar de fumar, consulte al [employer].  Use los dispositivos de [employer]ren Corporation se lo haya indicado el mdico.  Concurra a todas las visitas de seguimiento como se lo haya indicado el mdico. Esto es importante. Dnde encontrar ms informacin:  Air traffic controller de Reumatismo Articular y Arboriculturist Musculoesquelticas y Insurance underwriter [facility] Association of Arthritis and Musculoskeletal and Skin Diseases): www.niams.SouthExposed.es  [place] ([employer] on Aging): http://kim-miller.com/  Instituto Estadounidense de Reumatologa ([place] of Rheumatology): www.rheumatology.org Comunquese con un mdico si:  Su piel se pone roja.  Presenta una erupcin cutnea.  Siente un dolor que [place].  Tiene fiebre y siente dolor en la articulacin o el msculo. Solicite ayuda de inmediato si:  Express Scripts.  Pierde el apetito repentinamente.  Transpira durante la noche. Resumen  La artrosis es una clase de artritis que afecta el tejido que cubre los extremos de los Affiliated Computer Services  en las articulaciones (cartlago).  El desgaste del cartlago que cubre los extremos de los huesos relacionado con la edad, causa esta afeccin.  Los sntomas principales de esta enfermedad son dolor, hinchazn y Advertising account executive.  No hay cura para esta enfermedad, pero el tratamiento puede ayudar a Financial controller y Teacher, English as a foreign language el funcionamiento de Water engineer. Esta informacin no tiene Marine scientist el consejo del mdico. Asegrese de hacerle al mdico cualquier pregunta que tenga. Document Revised: 06/25/2017 Document Reviewed: 12/02/2012 Elsevier Patient Education  [place].  Please be sure medication list is accurate. If a new problem present, please set up appointment sooner than  planned today.

## 2020-03-18 MED FILL — LIDOCAINE 5 % PTCH: 5 | 90 days supply | Qty: 90 | Fill #0

## 2020-03-22 LAB — COMPLETE METABOLIC PANEL WITH GFR
AG Ratio: 1.3 (calc) (ref 1.0–2.5)
ALT: 22 U/L (ref 9–46)
AST: 23 U/L (ref 10–35)
Albumin: 4.5 g/dL (ref 3.6–5.1)
Alkaline phosphatase (APISO): 64 U/L (ref 35–144)
BUN: 18 mg/dL (ref 7–25)
CO2: 28 mmol/L (ref 20–32)
Calcium: 9.9 mg/dL (ref 8.6–10.3)
Chloride: 101 mmol/L (ref 98–110)
Creat: 1.06 mg/dL (ref 0.70–1.25)
GFR, Est African American: 87 mL/min/{1.73_m2} (ref >=60)
GFR, Est Non African American: 75 mL/min/{1.73_m2} (ref >=60)
Globulin: 3.6 g/dL (calc) (ref 1.9–3.7)
Glucose, Bld: 94 mg/dL (ref 65–99)
Potassium: 4 mmol/L (ref 3.5–5.3)
Sodium: 139 mmol/L (ref 135–146)
Total Bilirubin: 0.3 mg/dL (ref 0.2–1.2)
Total Protein: 8.1 g/dL (ref 6.1–8.1)

## 2020-03-22 LAB — LIPID PANEL
Cholesterol: 162 mg/dL (ref <200)
HDL: 41 mg/dL (ref >=40)
LDL Cholesterol (Calc): 92 mg/dL (calc)
Non-HDL Cholesterol (Calc): 121 mg/dL (calc) (ref <130)
Total CHOL/HDL Ratio: 4 (calc) (ref <5.0)
Triglycerides: 193 mg/dL — ABNORMAL HIGH (ref <150)

## 2020-03-22 LAB — SEDIMENTATION RATE: Sed Rate: 9 mm/h (ref 0–20)

## 2020-03-22 LAB — RHEUMATOID FACTOR: Rheumatoid fact SerPl-aCnc: <14 IU/mL (ref <14)

## 2020-03-22 LAB — C-REACTIVE PROTEIN: CRP: 4.2 mg/L (ref <8.0)

## 2020-03-22 LAB — CYCLIC CITRUL PEPTIDE ANTIBODY, IGG: Cyclic Citrullin Peptide Ab: <16 UNITS

## 2020-03-24 ENCOUNTER — Telehealth: Payer: Self-pay | Admitting: Family Medicine

## 2020-03-24 NOTE — Telephone Encounter (Signed)
I used the interpreter services to contact pt. He is all set for his appointment, nothing further needed.

## 2020-03-24 NOTE — Telephone Encounter (Signed)
° °  Can Dr. Martinique speak with this Patient - I tried to call the Patient  To inform him of his CT  Pt picked up his instructions; he is scheduled for a CT on 03/26/2020 @11 :45 he is to drink his contrast. One bottle -. at 9:45 and another bottle at 10:45.  Arrive for his appt at 11:30. His insurance has also been authorized by [employer] - His authorization number is  [id number]   .   Hey, This patient has called [place] leaving a very broken [place] message on our answering machine. He has asked to have someone give him a call that speaks [place]. I am not sure what he wants unless it is relating to the appointment that he has with [place] for a CT on Friday morning at @11 :45.  Per  Nunzio Cobbs farmer LB CT

## 2020-03-26 ENCOUNTER — Other Ambulatory Visit: Payer: Self-pay

## 2020-03-26 ENCOUNTER — Ambulatory Visit (INDEPENDENT_AMBULATORY_CARE_PROVIDER_SITE_OTHER)
Admission: RE | Admit: 2020-03-26 | Discharge: 2020-03-26 | Disposition: A | Discharge status: Home / Self Care | Payer: No Typology Code available for payment source | Source: Ambulatory Visit | Attending: Family Medicine | Admitting: Family Medicine

## 2020-03-26 DIAGNOSIS — G8929 Other chronic pain: Secondary | ICD-10-CM

## 2020-03-26 DIAGNOSIS — R1011 Right upper quadrant pain: Secondary | ICD-10-CM

## 2020-03-26 MED ORDER — IOHEXOL 300 MG/ML  SOLN
100.0000 mL | Freq: Once | INTRAMUSCULAR | Status: AC | PRN
  Administered 2020-03-26: 100 mL via INTRAVENOUS

## 2020-04-05 ENCOUNTER — Telehealth: Payer: Self-pay | Admitting: Family Medicine

## 2020-04-05 MED FILL — ATORVASTATIN CALCIUM 20 MG: 20 | 90 days supply | Qty: 90 | Fill #2

## 2020-04-05 NOTE — Telephone Encounter (Signed)
pts spouse is calling in to get his lab results.  She is aware that the results went out to the pts first and when the provider has resulted it someone from the office will give them a call.

## 2020-04-05 NOTE — Telephone Encounter (Signed)
Noted  

## 2020-04-07 ENCOUNTER — Encounter: Payer: Self-pay | Admitting: Family Medicine

## 2020-04-19 ENCOUNTER — Other Ambulatory Visit (HOSPITAL_COMMUNITY): Payer: Self-pay | Admitting: Urology

## 2020-04-19 MED FILL — TAMSULOSIN HCL 0.4 MG CAP: 0.4 | 90 days supply | Qty: 90 | Fill #0

## 2020-04-23 MED FILL — SPIRONOLACTONE 25 MG TABS: 25 | 90 days supply | Qty: 45 | Fill #1

## 2020-04-23 MED FILL — LISINOPRIL-HCTZ 20-12.5 MG: 20-12.5 | 90 days supply | Qty: 180 | Fill #1

## 2020-04-23 MED FILL — AMLODIPINE BESYLATE 5 MG TA: 5 | 90 days supply | Qty: 90 | Fill #1

## 2020-07-13 ENCOUNTER — Other Ambulatory Visit (HOSPITAL_COMMUNITY): Payer: Self-pay

## 2020-07-13 MED FILL — Atorvastatin Calcium Tab 20 MG (Base Equivalent): ORAL | 90 days supply | Qty: 90 | Fill #0 | Status: AC

## 2020-07-29 ENCOUNTER — Other Ambulatory Visit (HOSPITAL_COMMUNITY): Payer: Self-pay

## 2020-07-29 ENCOUNTER — Other Ambulatory Visit: Payer: Self-pay | Admitting: Family Medicine

## 2020-07-29 MED FILL — Amlodipine Besylate Tab 5 MG (Base Equivalent): ORAL | 90 days supply | Qty: 90 | Fill #0 | Status: AC

## 2020-07-29 MED FILL — Lisinopril & Hydrochlorothiazide Tab 20-12.5 MG: ORAL | 90 days supply | Qty: 180 | Fill #0 | Status: AC

## 2020-07-30 ENCOUNTER — Other Ambulatory Visit (HOSPITAL_COMMUNITY): Payer: Self-pay

## 2020-07-30 MED ORDER — SPIRONOLACTONE 25 MG PO TABS
ORAL_TABLET | Freq: Every day | ORAL | 1 refills | Status: DC
  Filled 2020-07-30: qty 45, 90d supply, fill #0
  Filled 2020-11-04: qty 45, 90d supply, fill #1

## 2020-08-04 IMAGING — US ULTRASOUND ABDOMEN LIMITED
1 series · 14 of 25 positions shown · non-contrast
Comparison: 05/01/2006 CT with contrast

CLINICAL DATA: Right upper quadrant pain for 1 month

EXAM:
ULTRASOUND ABDOMEN LIMITED RIGHT UPPER QUADRANT

[Series 2: ultrasound abdomen limited · 0.21mm/px · 14 of 83 slices shown]
[im 1/83]
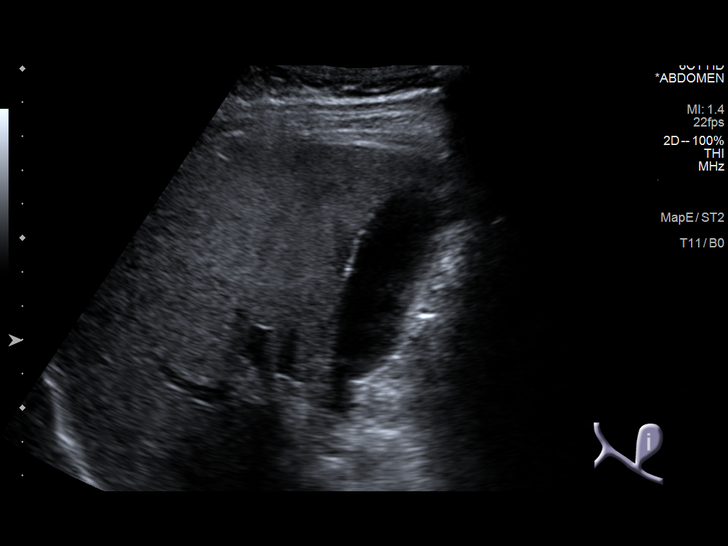
[im 7/83]
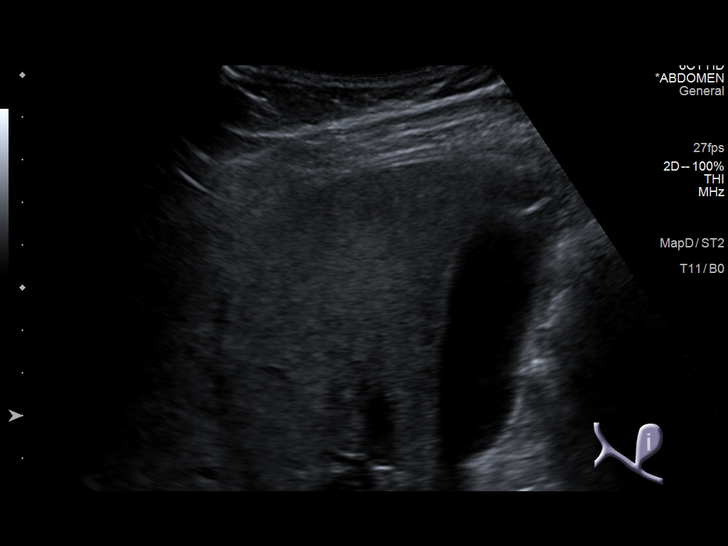
[im 14/83]
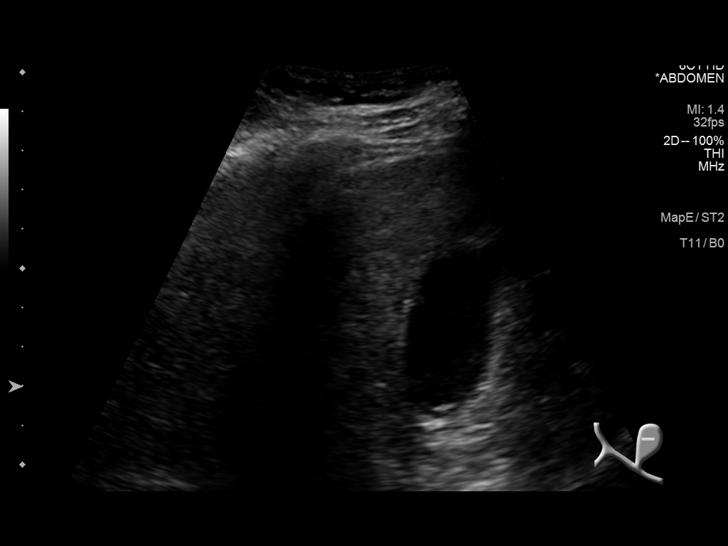
[im 21/83]
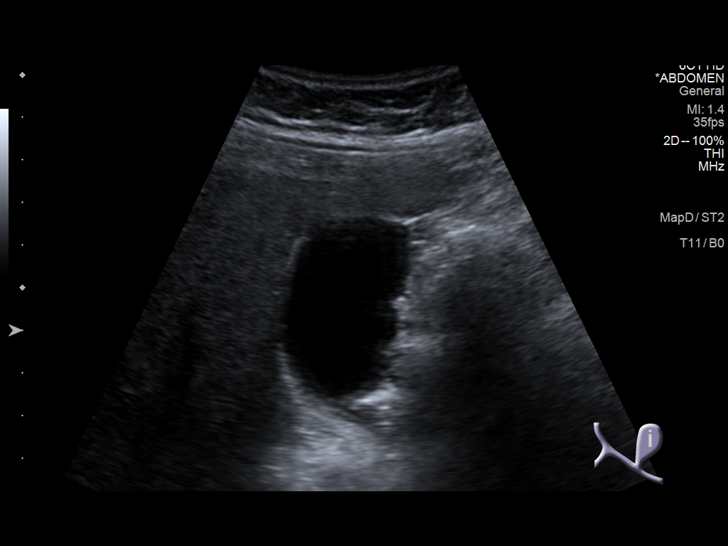
[im 28/83]
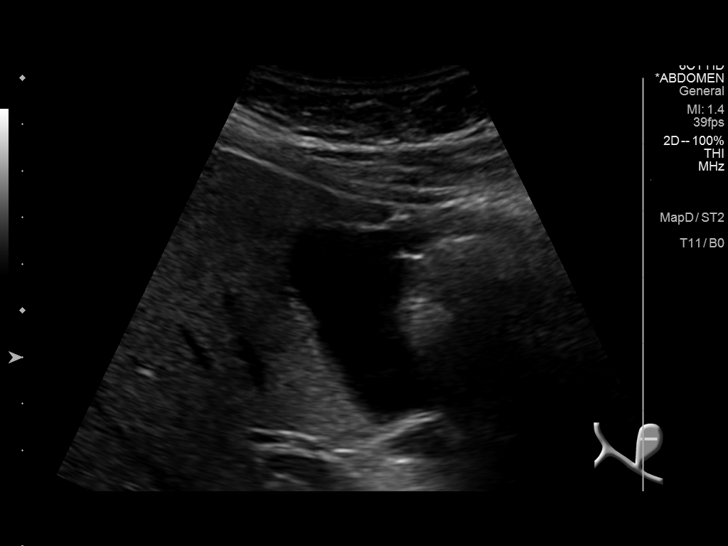
[im 31/83]
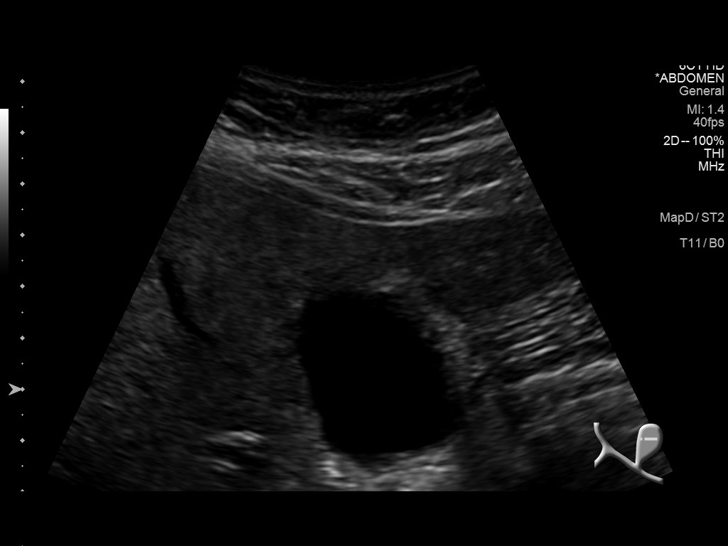
[im 38/83]
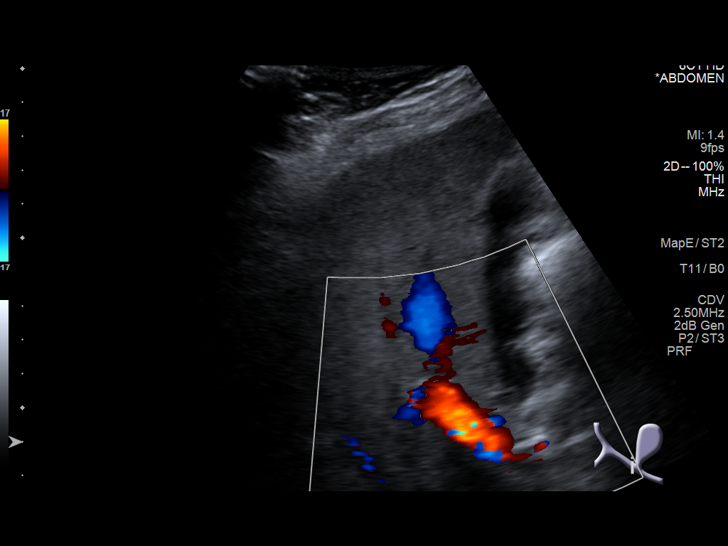
[im 45/83]
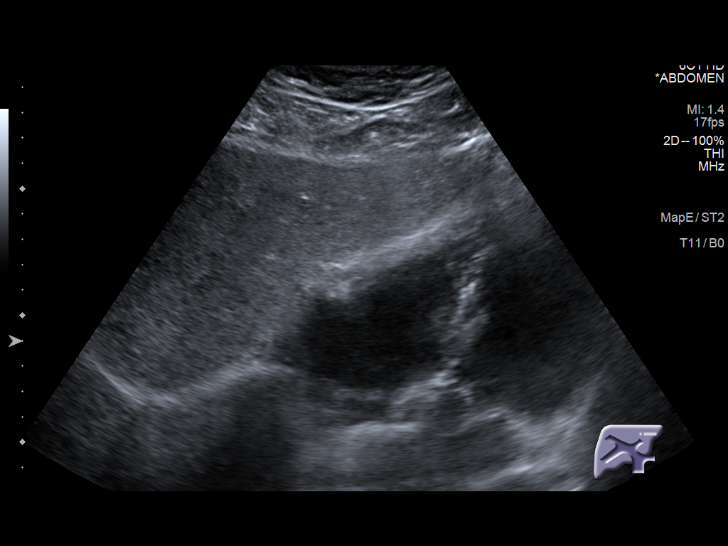
[im 52/83]
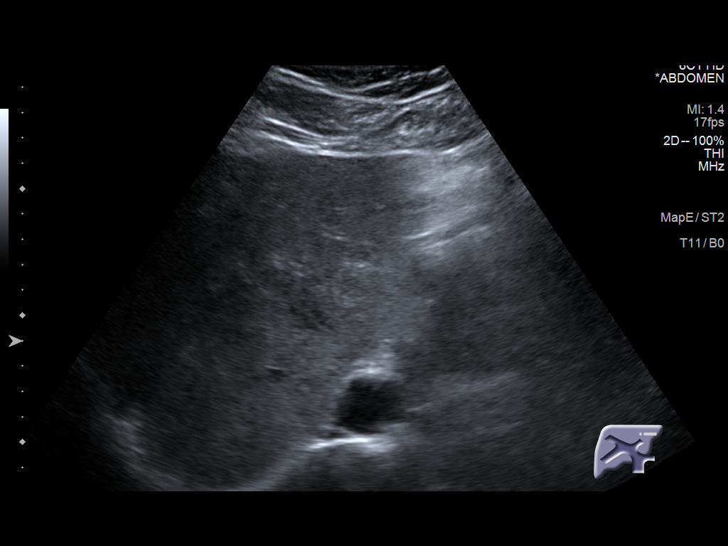
[im 55/83]
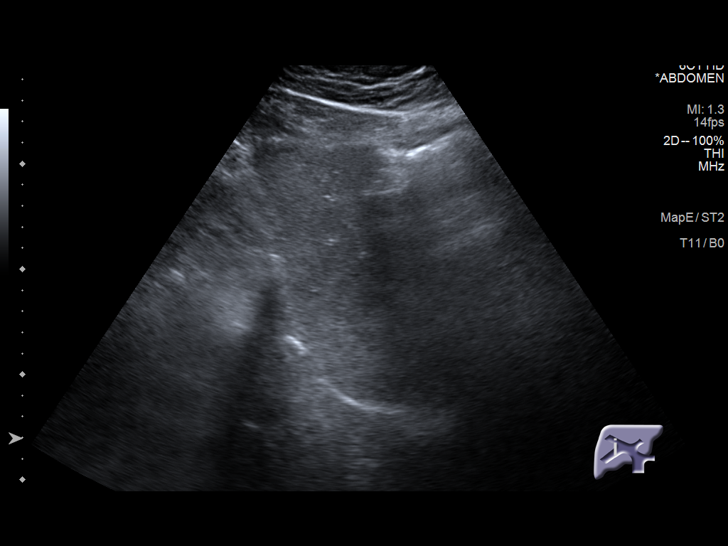
[im 62/83]
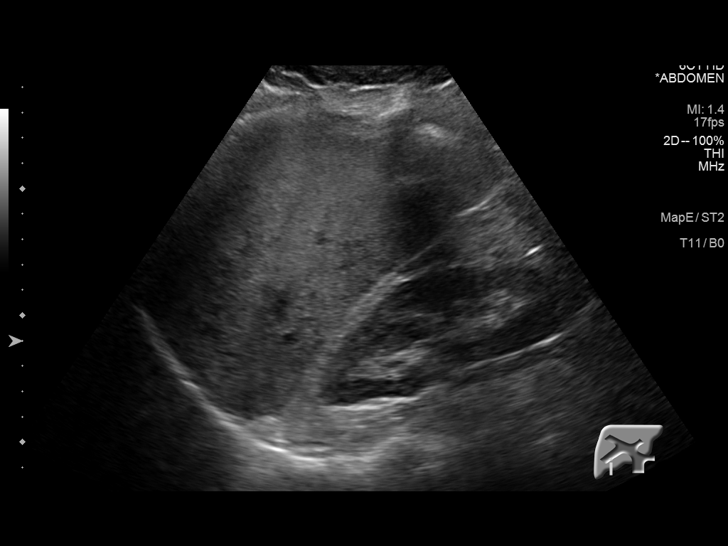
[im 69/83]
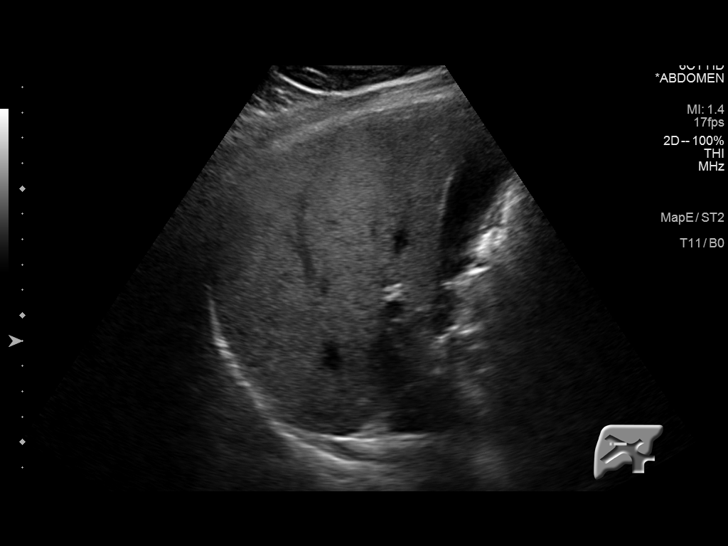
[im 76/83]
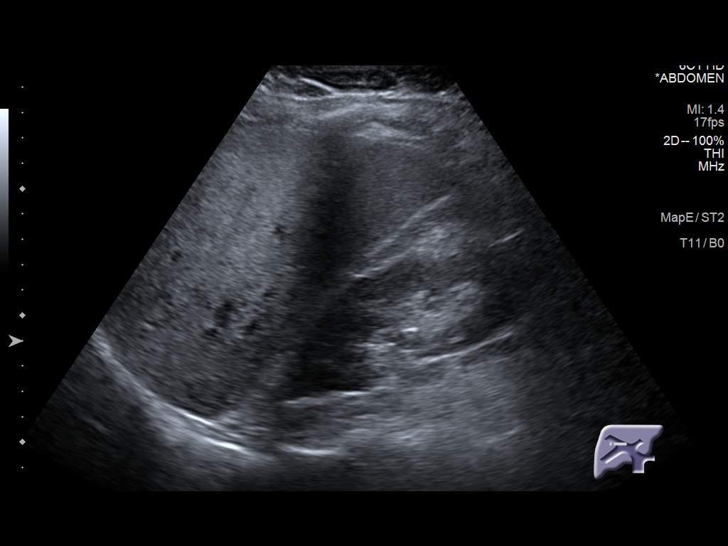
[im 83/83]
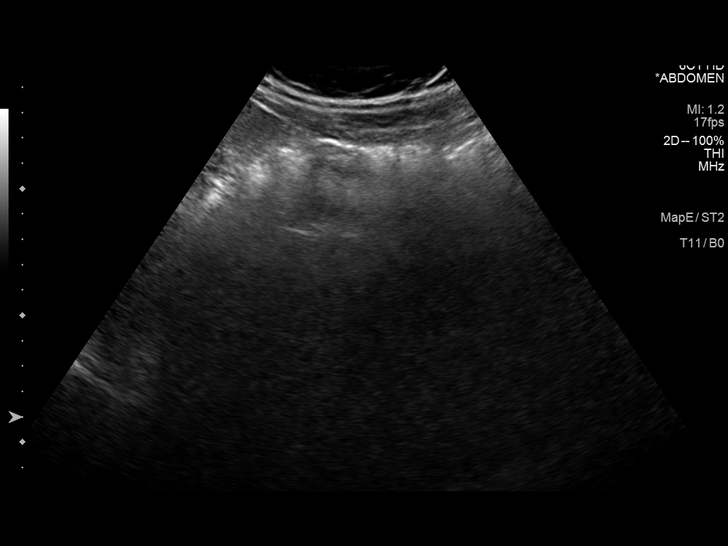

[14 of 25 positions shown; findings below may reference images not displayed]

FINDINGS: Gallbladder:

No gallstones or wall thickening visualized. No sonographic Murphy
sign noted by sonographer.

Common bile duct:

Diameter: Not visualized. Limited because of bowel gas. No biliary
dilatation.

Liver:

No focal lesion identified. Within normal limits in parenchymal
echogenicity. Portal vein is patent on color Doppler imaging with
normal direction of blood flow towards the liver.
IMPRESSION: Negative for gallstones or acute finding by ultrasound. Limited as
above

## 2020-08-19 ENCOUNTER — Other Ambulatory Visit (HOSPITAL_COMMUNITY): Payer: Self-pay

## 2020-08-19 MED FILL — Tamsulosin HCl Cap 0.4 MG: ORAL | 90 days supply | Qty: 90 | Fill #0 | Status: AC

## 2020-10-18 ENCOUNTER — Other Ambulatory Visit (HOSPITAL_COMMUNITY): Payer: Self-pay

## 2020-10-18 MED ORDER — TADALAFIL 5 MG PO TABS
ORAL_TABLET | ORAL | 11 refills | Status: DC
  Filled 2020-10-18: qty 30, 30d supply, fill #0
  Filled 2020-11-24: qty 90, 90d supply, fill #1
  Filled 2021-03-14: qty 90, 90d supply, fill #2
  Filled 2021-06-22: qty 90, 90d supply, fill #3

## 2020-10-18 MED FILL — Lidocaine Patch 5%: CUTANEOUS | 90 days supply | Qty: 90 | Fill #0 | Status: AC

## 2020-10-19 ENCOUNTER — Other Ambulatory Visit (HOSPITAL_COMMUNITY): Payer: Self-pay

## 2020-10-19 ENCOUNTER — Encounter: Payer: No Typology Code available for payment source | Admitting: Family Medicine

## 2020-10-19 ENCOUNTER — Ambulatory Visit (INDEPENDENT_AMBULATORY_CARE_PROVIDER_SITE_OTHER): Payer: No Typology Code available for payment source | Admitting: Family Medicine

## 2020-10-19 ENCOUNTER — Encounter: Payer: Self-pay | Admitting: Family Medicine

## 2020-10-19 ENCOUNTER — Other Ambulatory Visit: Payer: Self-pay

## 2020-10-19 VITALS — BP 128/80 | HR 85 | Resp 16 | Ht 67.0 in | Wt 204.0 lb

## 2020-10-19 DIAGNOSIS — Z13228 Encounter for screening for other metabolic disorders: Secondary | ICD-10-CM | POA: Diagnosis not present

## 2020-10-19 DIAGNOSIS — E785 Hyperlipidemia, unspecified: Secondary | ICD-10-CM

## 2020-10-19 DIAGNOSIS — Z130 Encounter for screening for diseases of the blood and blood-forming organs and certain disorders involving the immune mechanism: Secondary | ICD-10-CM | POA: Diagnosis not present

## 2020-10-19 DIAGNOSIS — Z1329 Encounter for screening for other suspected endocrine disorder: Secondary | ICD-10-CM | POA: Diagnosis not present

## 2020-10-19 DIAGNOSIS — N401 Enlarged prostate with lower urinary tract symptoms: Secondary | ICD-10-CM | POA: Diagnosis not present

## 2020-10-19 DIAGNOSIS — R0609 Other forms of dyspnea: Secondary | ICD-10-CM

## 2020-10-19 DIAGNOSIS — R0600 Dyspnea, unspecified: Secondary | ICD-10-CM

## 2020-10-19 DIAGNOSIS — R351 Nocturia: Secondary | ICD-10-CM

## 2020-10-19 DIAGNOSIS — Z0000 Encounter for general adult medical examination without abnormal findings: Secondary | ICD-10-CM

## 2020-10-19 DIAGNOSIS — G8929 Other chronic pain: Secondary | ICD-10-CM

## 2020-10-19 DIAGNOSIS — M25551 Pain in right hip: Secondary | ICD-10-CM

## 2020-10-19 DIAGNOSIS — I10 Essential (primary) hypertension: Secondary | ICD-10-CM

## 2020-10-19 LAB — CBC
HCT: 42.2 % (ref 39.0–52.0)
Hemoglobin: 14.9 g/dL (ref 13.0–17.0)
MCHC: 35.3 g/dL (ref 30.0–36.0)
MCV: 92.6 fl (ref 78.0–100.0)
Platelets: 206 10*3/uL (ref 150.0–400.0)
RBC: 4.55 Mil/uL (ref 4.22–5.81)
RDW: 12.9 % (ref 11.5–15.5)
WBC: 4.3 10*3/uL (ref 4.0–10.5)

## 2020-10-19 LAB — LIPID PANEL
Cholesterol: 148 mg/dL (ref 0–200)
HDL: 39 mg/dL — ABNORMAL LOW (ref >39.00)
LDL Cholesterol: 87 mg/dL (ref 0–99)
NonHDL: 109.27
Total CHOL/HDL Ratio: 4
Triglycerides: 111 mg/dL (ref 0.0–149.0)
VLDL: 22.2 mg/dL (ref 0.0–40.0)

## 2020-10-19 LAB — BRAIN NATRIURETIC PEPTIDE: Pro B Natriuretic peptide (BNP): 3 pg/mL (ref 0.0–100.0)

## 2020-10-19 LAB — BASIC METABOLIC PANEL
BUN: 18 mg/dL (ref 6–23)
CO2: 27 mEq/L (ref 19–32)
Calcium: 9.4 mg/dL (ref 8.4–10.5)
Chloride: 101 mEq/L (ref 96–112)
Creatinine, Ser: 1.21 mg/dL (ref 0.40–1.50)
GFR: 63.79 mL/min (ref >60.00)
Glucose, Bld: 89 mg/dL (ref 70–99)
Potassium: 3.9 mEq/L (ref 3.5–5.1)
Sodium: 136 mEq/L (ref 135–145)

## 2020-10-19 LAB — PSA: PSA: 1.28 ng/mL (ref 0.10–4.00)

## 2020-10-19 LAB — HEMOGLOBIN A1C: Hgb A1c MFr Bld: 5.8 % (ref 4.6–6.5)

## 2020-10-19 LAB — TSH: TSH: 2.25 u[IU]/mL (ref 0.35–5.50)

## 2020-10-19 MED ORDER — DICLOFENAC SODIUM 1 % EX GEL
4.0000 g | Freq: Four times a day (QID) | CUTANEOUS | 2 refills | Status: DC
  Filled 2020-10-19: qty 200, 14d supply, fill #0

## 2020-10-19 NOTE — Patient Instructions (Addendum)
A few things to remember from today's visit:   Routine general medical examination at a health care facility  Hyperlipidemia, unspecified hyperlipidemia type - Plan: Lipid panel  BPH associated with nocturia - Plan: PSA(Must document that pt has been informed of limitations of PSA testing.)  Screening for endocrine, metabolic and immunity disorder - Plan: Basic metabolic panel, Hemoglobin A1c  DOE (dyspnea on exertion) - Plan: EKG 12-Lead, CBC, Brain Natriuretic Peptide, DG Chest 2 View  Essential hypertension - Plan: TSH  If you need refills please call your pharmacy. Do not use My Chart to request refills or for acute issues that need immediate attention.    At least 150 minutes of moderate exercise per week, daily brisk walking for 15-30 min is a good exercise option. Healthy diet low in saturated (animal) fats and sweets and consisting of fresh fruits and vegetables, lean meats such as fish and white chicken and whole grains.  - Vaccines:  Tdap vaccine every 10 years.  Shingles vaccine recommended at age 47, could be given after 63 years of age but not sure about insurance coverage.  Pneumonia vaccines: Pneumovax at 11  -Screening recommendations for low/normal risk males:  Screening for diabetes at age 40 and every 3 years. Earlier screening if cardiovascular risk factors.  Lipid screening at 35 and every 3 years. Screening starts in younger males with cardiovascular risk factors.N/A  Colon cancer screening is now at age 55 but your insurance may not cover until age 43 .screening is recommended age 74.Please arrange appt with gastroenterologist.  Prostate cancer screening: some controversy, starts usually at 50: Rectal exam and PSA.  Aortic Abdominal Aneurism once between 67 and 29 years old if ever smoker.  Also recommended:  Dental visit- Brush and floss your teeth twice daily; visit your dentist twice a year. Eye doctor- Get an eye exam at least every 2  years. Helmet use- Always wear a helmet when riding a bicycle, motorcycle, rollerblading or skateboarding. Safe sex- If you may be exposed to sexually transmitted infections, use a condom. Seat belts- Seat belts can save your live; always wear one. Smoke/Carbon Monoxide detectors- These detectors need to be installed on the appropriate level of your home. Replace batteries at least once a year. Skin cancer- When out in the sun please cover up and use sunscreen 15 SPF or higher. Violence- If anyone is threatening or hurting you, please tell your healthcare provider.  Drink alcohol in moderation- Limit alcohol intake to one drink or less per day. Never drink and drive.   Please be sure medication list is accurate. If a new problem present, please set up appointment sooner than planned today.

## 2020-10-19 NOTE — Progress Notes (Signed)
HPI:  [name] is a 65 y.o.male here today for his routine physical examination.  Last CPE: 09/16/19. He lives with wife and parent in [place].  Regular exercise 3 or more times per week: He has not been back to the gym for 1-2 months. He is walking outdoors 2-3 miles 2-3 times per month. Following a healthy diet: He has not been consistent. Cooking at home.  Chronic medical problems: HTN, B12 deficiency,HLD,and back pain with radiculopathy.  Immunization History  Administered Date(s) Administered   Influenza,inj,Quad PF,6+ Mos 04/20/2016, 05/14/2017, 01/23/2018, 12/10/2018, 03/17/2020   Influenza-Unspecified 01/03/2013   Janssen (J&J) SARS-COV-2 Vaccination 06/14/2019   Td 06/18/2007   Tdap 07/13/2009, 01/03/2013, 06/27/2018   Zoster Recombinat (Shingrix) 09/16/2019, 11/17/2019   -Hep C screening: 03/17/20 NR. Last colon cancer screening: 04/10/2016. Last prostate ca screening: He follows with urologist q 6 months.He would like PSA checked. Last appt with urologist yesterday, states that blood work was not done.  Negative for high alcohol intake, tobacco use, or Hx of illicit drug use.  -Concerns and/or follow up today:   He would like a rx for Voltaren gel to rub on right hip.This is a chronic problem and Voltaren gel has helped . Lidocaine patch helps with back pain.  Today he is c/o DOE intermittent for the past 2 months. He did not note it problem initially but his wife did, so he has been monitoring problem and just happens when going up stairs at a normal pace and walking at work, from one office to another one on same level. He notices that he is breathing faster. No associated CP,diaphoresis,or palpitations.  He walks a few times per week and has not had SOB while doing so. No CP,SOB,or diaphoresis when exercising at the gym either. HTN: On Spironolactone 25 mg daily,Amlodipine 5 mg daily,and Lisinopril-HCTZ 20-12.5 mg daily. HLD: He is on Atorvastatin 20 mg  daily.  Component     Latest Ref Rng & Units 03/17/2020  Cholesterol     0 - 200 mg/dL 162  HDL Cholesterol     >39.00 mg/dL 41  Triglycerides     0.0 - 149.0 mg/dL 193 (H)  LDL Cholesterol (Calc)     mg/dL (calc) 92  Total CHOL/HDL Ratio      4.0  Non-HDL Cholesterol (Calc)     <130 mg/dL (calc) 121   GERD on Omeprazole 40 mg daily. No heartburn.  Review of Systems  Constitutional:  Positive for activity change. Negative for appetite change, fatigue and fever.  HENT:  Negative for mouth sores, nosebleeds, sore throat and trouble swallowing.   Eyes:  Negative for redness and visual disturbance.  Respiratory:  Negative for cough and wheezing.   Cardiovascular:  Negative for chest pain, palpitations and leg swelling.  Gastrointestinal:  Negative for abdominal pain, blood in stool, nausea and vomiting.  Endocrine: Negative for cold intolerance, heat intolerance, polydipsia, polyphagia and polyuria.  Genitourinary:  Negative for decreased urine volume, dysuria, genital sores, hematuria and testicular pain.  Musculoskeletal:  Positive for arthralgias and back pain. Negative for gait problem and myalgias.  Skin:  Negative for color change and rash.  Neurological:  Negative for dizziness, syncope, weakness and headaches.  Hematological:  Negative for adenopathy. Does not bruise/bleed easily.  Psychiatric/Behavioral:  Negative for confusion. The patient is not nervous/anxious.    Current Outpatient Medications on File Prior to Visit  Medication Sig Dispense Refill   amLODipine (NORVASC) 5 MG tablet TAKE 1 TABLET BY MOUTH  DAILY 90 tablet 2   aspirin EC 81 MG tablet Take by mouth.     cholecalciferol (VITAMIN D) 400 units TABS tablet Take 400 Units by mouth daily.     ferrous sulfate 325 (65 FE) MG EC tablet Take 325 mg by mouth 1 day or 1 dose.     lidocaine (LIDODERM) 5 % PLACE 1 PATCH ONTO THE SKIN DAILY. REMOVE & DISCARD PATCH WITHIN 12 HOURS OR AS DIRECTED BY MD 90 patch 1    lisinopril-hydrochlorothiazide (ZESTORETIC) 20-12.5 MG tablet TAKE 2 TABLETS BY MOUTH DAILY. 180 tablet 2   Omega-3 Fatty Acids (FISH OIL) 1000 MG CAPS Take 1,000 mg by mouth.     omeprazole (PRILOSEC) 20 MG capsule TAKE 1 CAPSULE BY MOUTH ONCE DAILY 90 capsule 1   spironolactone (ALDACTONE) 25 MG tablet TAKE 1/2 TABLET BY MOUTH DAILY. 45 tablet 1   tadalafil (CIALIS) 5 MG tablet Take 1 tablet by mouth daily 30 tablet 11   tamsulosin (FLOMAX) 0.4 MG CAPS capsule TAKE 1 CAPSULE BY MOUTH AT BEDTIME 90 capsule 3   vitamin B-12 (CYANOCOBALAMIN) 100 MCG tablet Take 100 mcg by mouth daily.     vitamin E 1000 UNIT capsule Take 1,000 Units by mouth daily.     No current facility-administered medications on file prior to visit.   Past Medical History:  Diagnosis Date   Arthritis    GERD (gastroesophageal reflux disease)    Hypertension    Tuberculosis    History reviewed. No pertinent surgical history.  No Known Allergies  Family History  Problem Relation Age of Onset   Hypertension Mother    Cancer Sister    Cancer Brother    Cancer Brother     Social History   Socioeconomic History   Marital status: Married    Spouse name: Not on file   Number of children: Not on file   Years of education: Not on file   Highest education level: Not on file  Occupational History   Occupation: Glass blower/designer  Tobacco Use   Smoking status: Never   Smokeless tobacco: Never  Substance and Sexual Activity   Alcohol use: Yes    Alcohol/week: 5.0 standard drinks    Types: 5 Cans of beer per week   Drug use: No   Sexual activity: Yes  Other Topics Concern   Not on file  Social History Narrative   Not on file   Social Determinants of Health   Financial Resource Strain: Not on file  Food Insecurity: Not on file  Transportation Needs: Not on file  Physical Activity: Not on file  Stress: Not on file  Social Connections: Not on file   Vitals:   10/19/20 1114  BP: 128/80  Pulse: 85   Resp: 16  SpO2: 99%   Body mass index is 31.95 kg/m.  Wt Readings from Last 3 Encounters:  10/19/20 204 lb (92.5 kg)  03/17/20 208 lb (94.3 kg)  09/16/19 195 lb 6 oz (88.6 kg)   Physical Exam Vitals and nursing note reviewed.  Constitutional:      General: He is not in acute distress.    Appearance: He is well-developed.  HENT:     Head: Normocephalic and atraumatic.     Right Ear: Tympanic membrane, ear canal and external ear normal.     Left Ear: Tympanic membrane, ear canal and external ear normal.     Mouth/Throat:     Mouth: Mucous membranes are moist.  Pharynx: Oropharynx is clear.  Eyes:     Extraocular Movements: Extraocular movements intact.     Conjunctiva/sclera: Conjunctivae normal.     Pupils: Pupils are equal, round, and reactive to light.  Neck:     Thyroid: No thyromegaly.     Trachea: No tracheal deviation.  Cardiovascular:     Rate and Rhythm: Normal rate and regular rhythm.     Pulses:          Dorsalis pedis pulses are 2+ on the right side and 2+ on the left side.     Heart sounds: No murmur heard. Pulmonary:     Effort: Pulmonary effort is normal. No respiratory distress.     Breath sounds: Normal breath sounds.  Chest:  Breasts:    Right: No supraclavicular adenopathy.     Left: No supraclavicular adenopathy.  Abdominal:     Palpations: Abdomen is soft. There is no hepatomegaly or mass.     Tenderness: There is no abdominal tenderness.  Genitourinary:    Comments: Deferred to urologist. Musculoskeletal:        General: No tenderness.     Cervical back: Normal range of motion.     Comments: No signs of synovitis.  Lymphadenopathy:     Cervical: No cervical adenopathy.     Upper Body:     Right upper body: No supraclavicular adenopathy.     Left upper body: No supraclavicular adenopathy.  Skin:    General: Skin is warm.     Findings: No erythema.  Neurological:     Mental Status: He is alert and oriented to person, place, and time.      Cranial Nerves: No cranial nerve deficit.     Sensory: No sensory deficit.     Coordination: Coordination normal.     Gait: Gait normal.     Deep Tendon Reflexes:     Reflex Scores:      Bicep reflexes are 2+ on the right side and 2+ on the left side.      Patellar reflexes are 2+ on the right side and 2+ on the left side.  ASSESSMENT AND PLAN:  [name] was seen today for annual exam and shortness of breath.  Diagnoses and all orders for this visit: Orders Placed This Encounter  Procedures   DG Chest 2 View   PSA(Must document that pt has been informed of limitations of PSA testing.)   Basic metabolic panel   Hemoglobin A1c   Lipid panel   CBC   Brain Natriuretic Peptide   TSH   EKG 12-Lead   Lab Results  Component Value Date   HGBA1C 5.8 10/19/2020   Lab Results  Component Value Date   CHOL 148 10/19/2020   HDL 39.00 (L) 10/19/2020   LDLCALC 87 10/19/2020   LDLDIRECT 157.6 06/25/2007   TRIG 111.0 10/19/2020   CHOLHDL 4 10/19/2020   Lab Results  Component Value Date   CREATININE 1.21 10/19/2020   BUN 18 10/19/2020   NA 136 10/19/2020   K 3.9 10/19/2020   CL 101 10/19/2020   CO2 27 10/19/2020   Lab Results                 PSA 1.28 10/19/2020   Lab Results  Component Value Date   TSH 2.25 10/19/2020   Lab Results  Component Value Date   WBC 4.3 10/19/2020   HGB 14.9 10/19/2020   HCT 42.2 10/19/2020   MCV 92.6 10/19/2020   PLT 206.0  10/19/2020   Routine general medical examination at a health care facility We discussed the importance of regular physical activity and healthy diet for prevention of chronic illness and/or complications. Preventive guidelines reviewed. Vaccination up to date.  Next CPE in a year.  The 10-year ASCVD risk score Mikey Bussing DC Brooke Bonito., et al., 2013) is: 11.8%   Values used to calculate the score:     Age: 41 years     Sex: Male     Is Non-Hispanic African American: No     Diabetic: No     Tobacco smoker: No      Systolic Blood Pressure: 269 mmHg     Is BP treated: Yes     HDL Cholesterol: 39 mg/dL     Total Cholesterol: 148 mg/dL  Hyperlipidemia, unspecified hyperlipidemia type Continue Atorvastatin 20 mg daily and low fat diet.  BPH associated with nocturia -     PSA(Must document that pt has been informed of limitations of PSA testing.)  Screening for endocrine, metabolic and immunity disorder -     Hemoglobin A1c -     Basic metabolic panel  DOE (dyspnea on exertion) We discussed possible etiologies. Hx and examination today do not suggest a serious process. EKG today SR,normal axis and intervals.No significant changes when compared with tracing on 03/28/18. Instructed about warning signs. Further recommendations according to lab and imaging result.  Essential hypertension BP adequately controlled. No changes in current management.  Chronic pain of right hip -     diclofenac Sodium (VOLTAREN) 1 % GEL; Apply 4 g topically 4 (four) times daily.  Return in 6 months (on 04/21/2021).   Raechell Singleton G. [place], MD  [facility] [place] office.

## 2020-10-20 ENCOUNTER — Other Ambulatory Visit: Payer: Self-pay | Admitting: Family Medicine

## 2020-10-20 ENCOUNTER — Other Ambulatory Visit (HOSPITAL_COMMUNITY): Payer: Self-pay

## 2020-10-20 DIAGNOSIS — E785 Hyperlipidemia, unspecified: Secondary | ICD-10-CM

## 2020-10-20 MED ORDER — ATORVASTATIN CALCIUM 20 MG PO TABS
ORAL_TABLET | Freq: Every day | ORAL | 3 refills | Status: DC
  Filled 2020-10-20: qty 90, 90d supply, fill #0
  Filled 2021-02-08: qty 90, 90d supply, fill #1
  Filled 2021-05-19: qty 90, 90d supply, fill #2

## 2020-10-28 ENCOUNTER — Other Ambulatory Visit: Payer: Self-pay

## 2020-10-28 ENCOUNTER — Ambulatory Visit (INDEPENDENT_AMBULATORY_CARE_PROVIDER_SITE_OTHER)
Admission: RE | Admit: 2020-10-28 | Discharge: 2020-10-28 | Disposition: A | Discharge status: Home / Self Care | Payer: No Typology Code available for payment source | Source: Ambulatory Visit | Attending: Family Medicine | Admitting: Family Medicine

## 2020-10-28 DIAGNOSIS — R0600 Dyspnea, unspecified: Secondary | ICD-10-CM | POA: Diagnosis not present

## 2020-10-28 DIAGNOSIS — R0609 Other forms of dyspnea: Secondary | ICD-10-CM

## 2020-11-04 ENCOUNTER — Other Ambulatory Visit (HOSPITAL_COMMUNITY): Payer: Self-pay

## 2020-11-04 ENCOUNTER — Other Ambulatory Visit: Payer: Self-pay | Admitting: Family Medicine

## 2020-11-04 DIAGNOSIS — I10 Essential (primary) hypertension: Secondary | ICD-10-CM

## 2020-11-05 ENCOUNTER — Other Ambulatory Visit (HOSPITAL_COMMUNITY): Payer: Self-pay

## 2020-11-05 MED ORDER — AMLODIPINE BESYLATE 5 MG PO TABS
ORAL_TABLET | Freq: Every day | ORAL | 2 refills | Status: DC
  Filled 2020-11-05: qty 90, 90d supply, fill #0
  Filled 2021-02-08: qty 90, 90d supply, fill #1
  Filled 2021-05-19: qty 90, 90d supply, fill #2

## 2020-11-05 MED ORDER — LISINOPRIL-HYDROCHLOROTHIAZIDE 20-12.5 MG PO TABS
2.0000 | ORAL_TABLET | Freq: Every day | ORAL | 2 refills | Status: DC
  Filled 2020-11-05: qty 180, 90d supply, fill #0
  Filled 2021-02-08: qty 180, 90d supply, fill #1
  Filled 2021-05-19: qty 180, 90d supply, fill #2

## 2020-11-16 ENCOUNTER — Encounter: Payer: Self-pay | Admitting: Family Medicine

## 2020-11-18 ENCOUNTER — Encounter: Payer: Self-pay | Admitting: Family Medicine

## 2020-11-19 ENCOUNTER — Other Ambulatory Visit (HOSPITAL_COMMUNITY): Payer: Self-pay

## 2020-11-19 ENCOUNTER — Telehealth: Payer: No Typology Code available for payment source | Admitting: Physician Assistant

## 2020-11-19 DIAGNOSIS — U071 COVID-19: Secondary | ICD-10-CM | POA: Diagnosis not present

## 2020-11-19 MED ORDER — MOLNUPIRAVIR EUA 200MG CAPSULE
4.0000 | ORAL_CAPSULE | Freq: Two times a day (BID) | ORAL | 0 refills | Status: AC
  Filled 2020-11-19: qty 40, 5d supply, fill #0

## 2020-11-19 NOTE — Progress Notes (Signed)
Virtual Visit Consent   Aaron Bowman, you are scheduled for a virtual visit with a [facility] provider today.     Just as with appointments in the office, your consent must be obtained to participate.  Your consent will be active for this visit and any virtual visit you may have with one of our providers in the next 365 days.     If you have a MyChart account, a copy of this consent can be sent to you electronically.  All virtual visits are billed to your insurance company just like a traditional visit in the office.    As this is a virtual visit, video technology does not allow for your provider to perform a traditional examination.  This may limit your provider's ability to fully assess your condition.  If your provider identifies any concerns that need to be evaluated in person or the need to arrange testing (such as labs, EKG, etc.), we will make arrangements to do so.     Although advances in technology are sophisticated, we cannot ensure that it will always work on either your end or our end.  If the connection with a video visit is poor, the visit may have to be switched to a telephone visit.  With either a video or telephone visit, we are not always able to ensure that we have a secure connection.     I need to obtain your verbal consent now.   Are you willing to proceed with your visit today?    Aaron Bowman has provided verbal consent on 11/19/2020 for a virtual visit (video or telephone).   Aaron Bowman, [place]   Date: 11/19/2020 10:16 AM   Virtual Visit via Video Note   I, Aaron Bowman, connected with  Aaron Bowman  ([id number], 08-25-1955) on 11/19/20 at 10:00 AM EDT by a video-enabled telemedicine application and verified that I am speaking with the correct person using two identifiers.  Location: Patient: Virtual Visit Location Patient: Home Provider: Virtual Visit Location Provider: Home Office   I discussed the limitations of evaluation and management by  telemedicine and the availability of in person appointments. The patient expressed understanding and agreed to proceed.    History of Present Illness: Aaron Bowman is a 65 y.o. who identifies as a male who was assigned male at birth, and is being seen today for COVID-19. Patient endorses symptom onset Tuesday PM with nasal congestion, cough (dry), body aches, and sore throat. Denies fever, chills, chest pain, sinus pain, SOB. His wife also has similar symptoms now, both of them testing positive on Wednesday. Has been trying to rest and using Delsym for cough but symptoms are continued. Was wanting to discuss medications for COVID-19. He reached out to his PCP but they are currently out of office and he was trying to be seen before the weekend.  HPI: HPI  Problems:  Patient Active Problem List   Diagnosis Date Noted   BPH associated with nocturia 10/19/2020   Hyperlipidemia 09/15/2019   Lower extremity numbness/burning sensation left thigh and foot 11/06/2018   B12 deficiency 11/06/2018   Chronic low back pain with right-sided sciatica 11/06/2018   Chronic hip pain 07/03/2008   Essential hypertension 06/18/2007    Allergies: No Known Allergies Medications:  Current Outpatient Medications:    molnupiravir EUA 200 mg CAPS, Take 4 capsules (800 mg total) by mouth 2 (two) times daily for 5 days., Disp: 40 capsule, Rfl: 0   amLODipine (NORVASC) 5 MG tablet,  TAKE 1 TABLET BY MOUTH DAILY, Disp: 90 tablet, Rfl: 2   aspirin EC 81 MG tablet, Take by mouth., Disp: , Rfl:    atorvastatin (LIPITOR) 20 MG tablet, Take 1 tablet by mouth daily., Disp: 90 tablet, Rfl: 3   cholecalciferol (VITAMIN D) 400 units TABS tablet, Take 400 Units by mouth daily., Disp: , Rfl:    diclofenac Sodium (VOLTAREN) 1 % GEL, Apply 4 g topically 4 (four) times daily., Disp: 150 g, Rfl: 2   ferrous sulfate 325 (65 FE) MG EC tablet, Take 325 mg by mouth 1 day or 1 dose., Disp: , Rfl:    lidocaine (LIDODERM) 5 %, PLACE 1 PATCH  ONTO THE SKIN DAILY. REMOVE & DISCARD PATCH WITHIN 12 HOURS OR AS DIRECTED BY MD, Disp: 90 patch, Rfl: 1   lisinopril-hydrochlorothiazide (ZESTORETIC) 20-12.5 MG tablet, TAKE 2 TABLETS BY MOUTH DAILY., Disp: 180 tablet, Rfl: 2   Omega-3 Fatty Acids (FISH OIL) 1000 MG CAPS, Take 1,000 mg by mouth., Disp: , Rfl:    omeprazole (PRILOSEC) 20 MG capsule, TAKE 1 CAPSULE BY MOUTH ONCE DAILY, Disp: 90 capsule, Rfl: 1   spironolactone (ALDACTONE) 25 MG tablet, TAKE 1/2 TABLET BY MOUTH DAILY., Disp: 45 tablet, Rfl: 1   tadalafil (CIALIS) 5 MG tablet, Take 1 tablet by mouth daily, Disp: 30 tablet, Rfl: 11   vitamin B-12 (CYANOCOBALAMIN) 100 MCG tablet, Take 100 mcg by mouth daily., Disp: , Rfl:    vitamin E 1000 UNIT capsule, Take 1,000 Units by mouth daily., Disp: , Rfl:   Observations/Objective: Patient is well-developed, well-nourished in no acute distress.  Resting comfortably at home.  Head is normocephalic, atraumatic.  No labored breathing. Speech is clear and coherent with logical content.  Patient is alert and oriented at baseline.   Assessment and Plan: 1. COVID-19 - molnupiravir EUA 200 mg CAPS; Take 4 capsules (800 mg total) by mouth 2 (two) times daily for 5 days.  Dispense: 40 capsule; Refill: 0 - MyChart COVID-19 home monitoring program; Future Patient with multiple risk factors for complicated course of illness. Discussed risks/benefits of antiviral medications including most common potential ADRs. Patient voiced understanding and would like to proceed with antiviral medication. They are candidate for molnupiravir. Rx sent to pharmacy. Supportive measures, OTC medications and vitamin regimen reviewed. Patient has been enrolled in a MyChart COVID symptom monitoring program. [name] reviewed in detail. Strict ER precautions discussed with patient.    Follow Up Instructions: I discussed the assessment and treatment plan with the patient. The patient was provided an opportunity to ask  questions and all were answered. The patient agreed with the plan and demonstrated an understanding of the instructions.  A copy of instructions were sent to the patient via MyChart.  The patient was advised to call back or seek an in-person evaluation if the symptoms worsen or if the condition fails to improve as anticipated.  Time:  I spent 15 minutes with the patient via telehealth technology discussing the above problems/concerns.    Aaron Rio, PA-C

## 2020-11-19 NOTE — Patient Instructions (Signed)
Aaron Bowman, thank you for joining [name], PA-C for today's virtual visit.  While this provider is not your primary care provider (PCP), if your PCP is located in our provider database this encounter information will be shared with them immediately following your visit.  Consent: (Patient) Aaron Bowman provided verbal consent for this virtual visit at the beginning of the encounter.  Current Medications:  Current Outpatient Medications:    amLODipine (NORVASC) 5 MG tablet, TAKE 1 TABLET BY MOUTH DAILY, Disp: 90 tablet, Rfl: 2   aspirin EC 81 MG tablet, Take by mouth., Disp: , Rfl:    atorvastatin (LIPITOR) 20 MG tablet, Take 1 tablet by mouth daily., Disp: 90 tablet, Rfl: 3   cholecalciferol (VITAMIN D) 400 units TABS tablet, Take 400 Units by mouth daily., Disp: , Rfl:    diclofenac Sodium (VOLTAREN) 1 % GEL, Apply 4 g topically 4 (four) times daily., Disp: 150 g, Rfl: 2   ferrous sulfate 325 (65 FE) MG EC tablet, Take 325 mg by mouth 1 day or 1 dose., Disp: , Rfl:    lidocaine (LIDODERM) 5 %, PLACE 1 PATCH ONTO THE SKIN DAILY. REMOVE & DISCARD PATCH WITHIN 12 HOURS OR AS DIRECTED BY MD, Disp: 90 patch, Rfl: 1   lisinopril-hydrochlorothiazide (ZESTORETIC) 20-12.5 MG tablet, TAKE 2 TABLETS BY MOUTH DAILY., Disp: 180 tablet, Rfl: 2   Omega-3 Fatty Acids (FISH OIL) 1000 MG CAPS, Take 1,000 mg by mouth., Disp: , Rfl:    omeprazole (PRILOSEC) 20 MG capsule, TAKE 1 CAPSULE BY MOUTH ONCE DAILY, Disp: 90 capsule, Rfl: 1   spironolactone (ALDACTONE) 25 MG tablet, TAKE 1/2 TABLET BY MOUTH DAILY., Disp: 45 tablet, Rfl: 1   tadalafil (CIALIS) 5 MG tablet, Take 1 tablet by mouth daily, Disp: 30 tablet, Rfl: 11   tamsulosin (FLOMAX) 0.4 MG CAPS capsule, TAKE 1 CAPSULE BY MOUTH AT BEDTIME, Disp: 90 capsule, Rfl: 3   vitamin B-12 (CYANOCOBALAMIN) 100 MCG tablet, Take 100 mcg by mouth daily., Disp: , Rfl:    vitamin E 1000 UNIT capsule, Take 1,000 Units by mouth daily., Disp: , Rfl:     Medications ordered in this encounter:  No orders of the defined types were placed in this encounter.    *If you need refills on other medications prior to your next appointment, please contact your pharmacy*  Follow-Up: Call back or seek an in-person evaluation if the symptoms worsen or if the condition fails to improve as anticipated.  Other Instructions Please keep well-hydrated and get plenty of rest. Start a saline nasal rinse to flush out your nasal passages. You can use plain Mucinex to help thin congestion. If you have a humidifier, running in the bedroom at night. I want you to start OTC vitamin D3 1000 units daily, vitamin C 1000 mg daily, and a zinc supplement. Please take prescribed medications as directed.  You have been enrolled in a MyChart symptom monitoring program. Please answer these questions daily so we can keep track of how you are doing.  You were to quarantine for 5 days from onset of your symptoms.  After day 5, if you have had no fever and you are feeling better, you can end quarantine but need to mask for an additional 5 days. After day 5 if you have a fever or are having significant symptoms, please quarantine for full 10 days.  If you note any worsening of symptoms, any significant shortness of breath or any chest pain, please seek ER evaluation ASAP.  Please do not delay care!    If you have been instructed to have an in-person evaluation today at a local Urgent Care facility, please use the link below. It will take you to a list of all of our available [facility], including address, phone number and hours of operation. Please do not delay care.  [facility]  If you or a family member do not have a primary care provider, use the link below to schedule a visit and establish care. When you choose a [facility] primary care physician or advanced practice provider, you gain a long-term partner in health. Find a Primary Care  Provider  Learn more about [facility]'s in-office and virtual care options: [facility]

## 2020-11-24 ENCOUNTER — Other Ambulatory Visit (HOSPITAL_COMMUNITY): Payer: Self-pay

## 2021-02-08 ENCOUNTER — Other Ambulatory Visit: Payer: Self-pay | Admitting: Family Medicine

## 2021-02-09 ENCOUNTER — Other Ambulatory Visit (HOSPITAL_COMMUNITY): Payer: Self-pay

## 2021-02-09 MED ORDER — SPIRONOLACTONE 25 MG PO TABS
ORAL_TABLET | Freq: Every day | ORAL | 1 refills | Status: DC
Start: 2021-02-09 — End: 2021-08-31
  Filled 2021-02-09: qty 45, 90d supply, fill #0
  Filled 2021-05-19: qty 45, 90d supply, fill #1

## 2021-03-14 ENCOUNTER — Other Ambulatory Visit (HOSPITAL_COMMUNITY): Payer: Self-pay

## 2021-04-21 NOTE — Progress Notes (Signed)
HPI: [name] is a 66 y.o. male, who is here today to follow on recent visit. He was last seen on 10/19/20.  Hypertension:  Medications:Amlodipine 5 mg daily, lisinopril-hctz 20-12.5 mg 2 tabs daily, and spironolactone 25 mg 1/2 tab daily. BP readings at home:He does not check it frequently, last time 20 days ago, 130's/70's. Side effects:None.  Negative for unusual or severe headache, visual changes, exertional chest pain, dyspnea,  focal weakness, or edema.  Lab Results  Component Value Date   CREATININE 1.21 10/19/2020   BUN 18 10/19/2020   NA 136 10/19/2020   K 3.9 10/19/2020   CL 101 10/19/2020   CO2 27 10/19/2020   Chronic RUQ pain, it has been intermittent for years. Sunday RUQ pain, woke him up, severe. It lasted 5 seconds. No associated symptoms. He has not identified exacerbating or alleviating factors. RUQ abdominal US 08/2018:Negative.  Abdominal CT 03/2020: Prostatic enlargement.  Small LEFT renal and hepatic cysts.  No acute intra-abdominal or intrapelvic abnormalities.  Aortic Atherosclerosis (ICD10-I70.0).  Lab Results  Component Value Date   ALT 22 03/17/2020   AST 23 03/17/2020   ALKPHOS 61 09/16/2019   BILITOT 0.3 03/17/2020   Review of Systems  Constitutional:  Negative for activity change, appetite change, fatigue and fever.  HENT:  Negative for nosebleeds and sore throat.   Respiratory:  Negative for cough and wheezing.   Gastrointestinal:  Negative for abdominal pain, nausea and vomiting.  Genitourinary:  Negative for decreased urine volume, dysuria and hematuria.  Musculoskeletal:  Positive for back pain (chronic). Negative for gait problem.  Skin:  Negative for pallor and rash.  Neurological:  Negative for syncope, facial asymmetry and numbness.  Rest see pertinent positives and negatives per HPI. Current Outpatient Medications on File Prior to Visit  Medication Sig Dispense Refill   amLODipine (NORVASC) 5 MG tablet TAKE 1 TABLET  BY MOUTH DAILY 90 tablet 2   atorvastatin (LIPITOR) 20 MG tablet Take 1 tablet by mouth daily. 90 tablet 3   cholecalciferol (VITAMIN D) 400 units TABS tablet Take 400 Units by mouth daily.     diclofenac Sodium (VOLTAREN) 1 % GEL Apply 4 g topically 4 (four) times daily. 150 g 2   ferrous sulfate 325 (65 FE) MG EC tablet Take 325 mg by mouth 1 day or 1 dose.     lisinopril-hydrochlorothiazide (ZESTORETIC) 20-12.5 MG tablet TAKE 2 TABLETS BY MOUTH DAILY. 180 tablet 2   Omega-3 Fatty Acids (FISH OIL) 1000 MG CAPS Take 1,000 mg by mouth.     omeprazole (PRILOSEC) 20 MG capsule TAKE 1 CAPSULE BY MOUTH ONCE DAILY 90 capsule 1   spironolactone (ALDACTONE) 25 MG tablet TAKE 1/2 TABLET BY MOUTH DAILY. 45 tablet 1   tadalafil (CIALIS) 5 MG tablet Take 1 tablet by mouth daily 30 tablet 11   vitamin B-12 (CYANOCOBALAMIN) 100 MCG tablet Take 100 mcg by mouth daily.     vitamin E 1000 UNIT capsule Take 1,000 Units by mouth daily.     No current facility-administered medications on file prior to visit.   Past Medical History:  Diagnosis Date   Arthritis    GERD (gastroesophageal reflux disease)    Hypertension    Tuberculosis    No Known Allergies  Social History   Socioeconomic History   Marital status: Married    Spouse name: Not on file   Number of children: Not on file   Years of education: Not on file   Highest education  level: Not on file  Occupational History   Occupation: Glass blower/designer  Tobacco Use   Smoking status: Never   Smokeless tobacco: Never  Substance and Sexual Activity   Alcohol use: Yes    Alcohol/week: 5.0 standard drinks    Types: 5 Cans of beer per week   Drug use: No   Sexual activity: Yes  Other Topics Concern   Not on file  Social History Narrative   Not on file   Social Determinants of Health   Financial Resource Strain: Not on file  Food Insecurity: Not on file  Transportation Needs: Not on file  Physical Activity: Unknown   Days of Exercise per  Week: Patient refused   Minutes of Exercise per Session: Not on file  Stress: Not on file  Social Connections: Unknown   Frequency of Communication with Friends and Family: Not on file   Frequency of Social Gatherings with Friends and Family: Not on file   Attends Religious Services: Not on file   Active Member of Clubs or Organizations: Patient refused   Attends Archivist Meetings: Not on file   Marital Status: Not on file   Vitals:   04/22/21 1423  BP: 132/80  Pulse: 92  Resp: 16  SpO2: 98%   Body mass index is 32.26 kg/m.  Physical Exam Vitals and nursing note reviewed.  Constitutional:      General: He is not in acute distress.    Appearance: He is well-developed.  HENT:     Head: Normocephalic and atraumatic.     Mouth/Throat:     Mouth: Mucous membranes are moist.     Pharynx: Oropharynx is clear.  Eyes:     Conjunctiva/sclera: Conjunctivae normal.  Cardiovascular:     Rate and Rhythm: Normal rate and regular rhythm.     Pulses:          Dorsalis pedis pulses are 2+ on the right side and 2+ on the left side.     Heart sounds: No murmur heard. Pulmonary:     Effort: Pulmonary effort is normal. No respiratory distress.     Breath sounds: Normal breath sounds.  Chest:     Chest wall: No tenderness or crepitus.  Abdominal:     Palpations: Abdomen is soft. There is no hepatomegaly or mass.     Tenderness: There is no abdominal tenderness.  Lymphadenopathy:     Cervical: No cervical adenopathy.  Skin:    General: Skin is warm.     Findings: No erythema or rash.  Neurological:     Mental Status: He is alert and oriented to person, place, and time.     Cranial Nerves: No cranial nerve deficit.     Gait: Gait normal.  Psychiatric:     Comments: Well groomed, good eye contact.   ASSESSMENT AND PLAN: [name] was seen today for follow-up.  Diagnoses and all orders for this visit: Orders Placed This Encounter  Procedures   Basic metabolic panel    Hemoglobin A1c   Lab Results  Component Value Date   CREATININE 1.19 04/22/2021   BUN 17 04/22/2021   NA 136 04/22/2021   K 3.9 04/22/2021   CL 101 04/22/2021   CO2 26 04/22/2021   Lab Results  Component Value Date   HGBA1C 5.8 04/22/2021   Atherosclerosis of aorta ([place]) Seen on abdominal CT in 03/2020. Continue Atorvastatin 20 mg daily. He is not fasting today.  Essential hypertension BP adequately controlled. Continue :Amlodipine 5  mg daily, lisinopril-hctz 20-12.5 mg 2 tabs daily, and spironolactone 25 mg 1/2 tab daily. Monitor BP regularly. Continue low salt/DASH diet.  Chronic RUQ pain We reviewed possible etiologies. It is not related with food intake. ? Musculoskeletal.  We could consider GI consultation, he prefers to hold on this for now, will let me know if she changes his mind.   Prediabetes A healthy life style for diabetes prevention encouraged.  Return in about 26 weeks (around 10/21/2021) for cpe.  Hanna Ra G. [place], MD  [facility]. [place] office.

## 2021-04-22 ENCOUNTER — Encounter: Payer: Self-pay | Admitting: Family Medicine

## 2021-04-22 ENCOUNTER — Ambulatory Visit (INDEPENDENT_AMBULATORY_CARE_PROVIDER_SITE_OTHER): Payer: No Typology Code available for payment source | Admitting: Family Medicine

## 2021-04-22 VITALS — BP 132/80 | HR 92 | Resp 16 | Ht 67.0 in | Wt 206.0 lb

## 2021-04-22 DIAGNOSIS — I10 Essential (primary) hypertension: Secondary | ICD-10-CM

## 2021-04-22 DIAGNOSIS — R7303 Prediabetes: Secondary | ICD-10-CM | POA: Diagnosis not present

## 2021-04-22 DIAGNOSIS — R1011 Right upper quadrant pain: Secondary | ICD-10-CM | POA: Diagnosis not present

## 2021-04-22 DIAGNOSIS — I700 Atherosclerosis of aorta: Secondary | ICD-10-CM | POA: Insufficient documentation

## 2021-04-22 DIAGNOSIS — G8929 Other chronic pain: Secondary | ICD-10-CM

## 2021-04-22 LAB — BASIC METABOLIC PANEL
BUN: 17 mg/dL (ref 6–23)
CO2: 26 mEq/L (ref 19–32)
Calcium: 9.3 mg/dL (ref 8.4–10.5)
Chloride: 101 mEq/L (ref 96–112)
Creatinine, Ser: 1.19 mg/dL (ref 0.40–1.50)
GFR: 64.85 mL/min (ref >60.00)
Glucose, Bld: 97 mg/dL (ref 70–99)
Potassium: 3.9 mEq/L (ref 3.5–5.1)
Sodium: 136 mEq/L (ref 135–145)

## 2021-04-22 LAB — HEMOGLOBIN A1C: Hgb A1c MFr Bld: 5.8 % (ref 4.6–6.5)

## 2021-04-22 NOTE — Assessment & Plan Note (Addendum)
We reviewed possible etiologies. It is not related with food intake. ? Musculoskeletal.  We could consider GI consultation, he prefers to hold on this for now, will let me know if she changes his mind.

## 2021-04-22 NOTE — Patient Instructions (Addendum)
A few things to remember from today's visit:  Essential hypertension - Plan: Basic metabolic panel, Hemoglobin A1c  If you need refills please call your pharmacy. Do not use My Chart to request refills or for acute issues that need immediate attention.   No changes today.  Please be sure medication list is accurate. If a new problem present, please set up appointment sooner than planned today.  Plan de alimentacin DASH DASH Eating Plan DASH es la sigla en ingls de Enfoques Alimentarios para Detener la Hipertensin. El plan de alimentacin DASH ha demostrado: Bajar la presin arterial elevada (hipertensin). Reducir el riesgo de diabetes tipo 2, enfermedad cardaca y accidente cerebrovascular. Ayudar a perder peso. Consejos para seguir Photographer las etiquetas de los alimentos Verifique la cantidad de sal (sodio) por porcin en las etiquetas de los alimentos. Elija alimentos con menos del 5 por ciento del valor diario de sodio. Generalmente, los alimentos con menos de 300 miligramos (mg) de sodio por porcin se encuadran dentro de este plan alimentario. Para encontrar cereales integrales, busque la palabra "integral" como primera palabra en la lista de ingredientes. Al ir de compras Compre productos en los que en su etiqueta diga: bajo contenido de sodio o sin agregado de sal. Compre alimentos frescos. Evite los alimentos enlatados y comidas precocidas o congeladas. Al cocinar Evite agregar sal cuando cocine. Use hierbas o aderezos sin sal, en lugar de sal de mesa o sal marina. Consulte al mdico o farmacutico antes de usar sustitutos de la sal. No fra los alimentos. A la hora de cocinar los alimentos opte por hornearlos, hervirlos, grillarlos, asarlos al horno y asarlos a Administrator, arts. Cocine con aceites cardiosaludables, como oliva, canola, aguacate, soja o girasol. Planificacin de las comidas  Consuma una dieta equilibrada, que incluya lo siguiente: 4 o ms porciones  de frutas y 4 o ms porciones de Set designer. Trate de que medio plato de cada comida sea de frutas y verduras. De 6 a 8 porciones de [employer]. Menos de 6 onzas (170 g) de carne, aves o pescado Games developer. Una porcin de 3 onzas (85 g) de carne tiene casi el mismo tamao que un mazo de cartas. Un huevo equivale a 1 onza (28 g). De 2 a 3 porciones de productos lcteos descremados por da. Una porcin es 1 taza (237 ml). 1 porcin de frutos secos, semillas o frijoles 5 veces por semana. De 2 a 3 porciones de grasas cardiosaludables. Las grasas saludables llamadas cidos grasos omega-3 se encuentran en alimentos como las nueces, las semillas de [place], las leches fortificadas y [place]. Estas grasas tambin se encuentran en los pescados de agua fra, como la sardina, el salmn y la caballa. Limite la cantidad que consume de: Alimentos enlatados o envasados. Alimentos con alto contenido de grasa trans, como algunos alimentos fritos. Alimentos con alto contenido de grasa saturada, como carne con grasa. Postres y otros dulces, bebidas azucaradas y otros alimentos con azcar agregada. Productos lcteos enteros. No le agregue sal a los alimentos antes de probarlos. No coma ms de 4 yemas de huevo por semana. Trate de comer al menos 2 comidas vegetarianas por semana. Consuma ms comida casera y menos de restaurante, de bares y comida rpida. Estilo de vida Cuando coma en un restaurante, pida que preparen su comida con menos sal o, en lo posible, sin nada de sal. Si bebe alcohol: Limite la cantidad que bebe: De 0 a 1 medida por da  para las mujeres que no estn embarazadas. De 0 a 2 medidas por da para los hombres. Est atento a la cantidad de alcohol que hay en las bebidas que toma. En los [place], una medida equivale a una botella de cerveza de 12 oz (355 ml), un vaso de vino de 5 oz (148 ml) o un vaso de una bebida alcohlica de alta graduacin de 1 oz (44  ml). Informacin general Evite ingerir ms de 2300 mg de sal por da. Si tiene hipertensin, es posible que necesite reducir la ingesta de sodio a 1,500 mg por da. Trabaje con su mdico para mantener un peso saludable o perder [employer]. Pregntele cul es el peso recomendado para usted. Realice al menos 30 minutos de ejercicio que haga que se acelere su corazn (ejercicio Arboriculturist) la [employer] de la [place]. Estas actividades pueden incluir caminar, nadar o andar en bicicleta. Trabaje con su mdico o nutricionista para ajustar su plan alimentario a sus necesidades calricas personales. Qu alimentos debo comer? Frutas Todas las frutas frescas, congeladas o disecadas. Frutas enlatadas en jugo natural (sin agregado de azcar). Verduras Verduras frescas o congeladas (crudas, al vapor, asadas o grilladas). Jugos de tomate y verduras con bajo contenido de sodio o reducidos en sodio. Salsa y pasta de tomate con bajo contenido de sodio o reducidas en sodio. Verduras enlatadas con bajo contenido de sodio o reducidas en sodio. Granos Pan de salvado o integral. Pasta de salvado o integral. Arroz integral. Avena. Quinua. Trigo burgol. Cereales integrales y con bajo contenido de sodio. Pan pita. Galletitas de [place] con bajo contenido de [place] y [place]. Tortillas de [place] integral. Carnes y otras protenas Pollo o pavo sin piel. Carne de pollo o de [place]. Cerdo desgrasado. Pescado y [employer]. Claras de huevo. Porotos, guisantes o lentejas secos. Frutos secos, mantequilla de frutos secos y semillas sin sal. Frijoles enlatados sin sal. Cortes de carne vacuna magra, desgrasada. Carne precocida o curada magra y baja en sodio, como embutidos o panes de carne. Lcteos Leche descremada (1 %) o descremada. Quesos reducidos en grasa, con bajo contenido de grasa o descremados. Queso blanco o ricota sin grasa, con bajo contenido de [place]. Yogur semidescremado o descremado. Queso con bajo contenido de [place] y  [place]. Grasas y [employer] untables que no contengan grasas trans. Aceite vegetal. Lubertha Basque y aderezos para ensaladas livianos, reducidos en grasa o con bajo contenido de grasas (reducidos en sodio). Aceite de canola, crtamo, oliva, aguacate, soja y [place]. [place]. Alios y condimentos Hierbas. Especias. Mezclas de condimentos sin sal. Otros alimentos Palomitas de maz y pretzels sin sal. Dulces con bajo contenido de grasas. Es posible que los productos que se enumeran ms [place] no constituyan una lista completa de los alimentos y las bebidas que puede tomar. Consulte a un nutricionista para obtener ms informacin. Qu alimentos debo evitar? Lambert Mody Fruta enlatada en almbar liviano o espeso. Frutas cocidas en aceite. Frutas con salsa de crema o mantequilla. Verduras Verduras con crema o fritas. Verduras en [place]. Verduras enlatadas regulares (que no sean con bajo contenido de sodio o reducidas en sodio). Pasta y salsa de tomates enlatadas regulares (que no sean con bajo contenido de sodio o reducidas en sodio). Jugos de tomate y verduras regulares (que no sean con bajo contenido de sodio o reducidos en sodio). Pepinillos. Aceitunas. Granos Productos de panificacin hechos con grasa, como medialunas, magdalenas y algunos panes. Comidas con arroz o pasta seca listas para usar. Carnes y [place]  protenas Cortes de carne con alto contenido de [place]. Costillas. Carne frita. [place]. [place], salame y otras carnes precocidas o curadas, como embutidos o panes de carne. Grasa de la espalda del cerdo (panceta). [place]. Frutos secos y semillas con sal. Frijoles enlatados con agregado de sal. Pescado enlatado o ahumado. Huevos enteros o yemas. Pollo o pavo con piel. Lcteos Leche entera o al 2 %, crema y mitad leche y mitad crema. Queso crema entero o con toda su grasa. Yogur entero o endulzado. Quesos con toda su grasa. Sustitutos de cremas no lcteas. Coberturas batidas.  Quesos para untar y quesos procesados. Grasas y [employer]. Margarina en barra. [place]. Lardo. Mantequilla clarificada. Grasa de panceta. Aceites tropicales como aceite de coco, palmiste o palma. Alios y condimentos Sal de cebolla, sal de ajo, sal condimentada, sal de mesa y sal marina. Salsa [place]. Salsa trtara. Salsa barbacoa. Salsa teriyaki. Salsa de soja, incluso la que tiene contenido reducido de [place]. Salsa de carne. Salsas en lata y envasadas. Salsa de pescado. Salsa de [place]. Salsa rosada. Rbanos picantes comprados en tiendas. [place]. [place]. Saborizantes y tiernizantes para carne. Caldo en cubitos. Salsas picantes. Adobos preelaborados o envasados. Aderezos para tacos preelaborados o envasados. Salsas de pepinillos. Aderezos comunes para ensalada. Otros alimentos Palomitas de maz y pretzels con sal. Es posible que los productos que se enumeran ms arriba no constituyan una lista completa de los alimentos y las bebidas que Nurse, adult. Consulte a un nutricionista para obtener ms informacin. Dnde buscar ms informacin National Heart, Lung, and Blood Institute ([place], los Pulmones y Herbalist): https://wilson-eaton.com/ American Heart Association (Asociacin Estadounidense del Corazn): www.heart.org Academy of Nutrition and Dietetics (Academia de Nutricin y Information systems manager): www.eatright.[place] ([place]): www.kidney.org Resumen El plan de alimentacin DASH ha demostrado bajar la presin arterial elevada (hipertensin). Tambin puede reducir [employer] de diabetes tipo 2, enfermedad cardaca y accidente cerebrovascular. Cuando siga el plan de alimentacin DASH, trate de comer ms frutas frescas y verduras, cereales integrales, carnes magras, lcteos descremados y grasas cardiosaludables. Con el plan de alimentacin DASH, deber limitar el consumo de sal (sodio) a 2,300 mg por da. Si tiene  hipertensin, es posible que necesite reducir la ingesta de sodio a 1,500 mg por da. Trabaje con su mdico o nutricionista para ajustar su plan alimentario a sus necesidades calricas personales. Esta informacin no tiene Marine scientist el consejo del mdico. Asegrese de hacerle al mdico cualquier pregunta que tenga. Document Revised: 05/01/2019 Document Reviewed: 05/01/2019 Elsevier Patient Education  2022 [employer].

## 2021-04-22 NOTE — Assessment & Plan Note (Addendum)
BP adequately controlled. Continue :Amlodipine 5 mg daily, lisinopril-hctz 20-12.5 mg 2 tabs daily, and spironolactone 25 mg 1/2 tab daily. Monitor BP regularly. Continue low salt/DASH diet.

## 2021-04-22 NOTE — Assessment & Plan Note (Signed)
A healthy life style for diabetes prevention encouraged.

## 2021-04-23 ENCOUNTER — Encounter: Payer: Self-pay | Admitting: Family Medicine

## 2021-05-19 ENCOUNTER — Other Ambulatory Visit (HOSPITAL_COMMUNITY): Payer: Self-pay

## 2021-05-20 ENCOUNTER — Other Ambulatory Visit (HOSPITAL_COMMUNITY): Payer: Self-pay

## 2021-06-22 ENCOUNTER — Other Ambulatory Visit (HOSPITAL_COMMUNITY): Payer: Self-pay

## 2021-08-31 ENCOUNTER — Other Ambulatory Visit: Payer: Self-pay | Admitting: Family Medicine

## 2021-08-31 ENCOUNTER — Other Ambulatory Visit (HOSPITAL_COMMUNITY): Payer: Self-pay

## 2021-08-31 DIAGNOSIS — I10 Essential (primary) hypertension: Secondary | ICD-10-CM

## 2021-08-31 MED ORDER — LISINOPRIL-HYDROCHLOROTHIAZIDE 20-12.5 MG PO TABS
2.0000 | ORAL_TABLET | Freq: Every day | ORAL | 2 refills | Status: DC
Start: 2021-08-31 — End: 2021-12-02
  Filled 2021-08-31: qty 180, 90d supply, fill #0

## 2021-08-31 MED ORDER — SPIRONOLACTONE 25 MG PO TABS
ORAL_TABLET | Freq: Every day | ORAL | 2 refills | Status: DC
  Filled 2021-08-31: qty 45, 90d supply, fill #0

## 2021-08-31 MED ORDER — AMLODIPINE BESYLATE 5 MG PO TABS
ORAL_TABLET | Freq: Every day | ORAL | 2 refills | Status: DC
  Filled 2021-08-31: qty 90, 90d supply, fill #0
  Filled 2022-01-05: qty 90, 90d supply, fill #1
  Filled 2022-04-28: qty 90, 90d supply, fill #2

## 2021-11-27 ENCOUNTER — Encounter: Payer: Self-pay | Admitting: Family Medicine

## 2021-11-28 ENCOUNTER — Encounter (HOSPITAL_COMMUNITY): Payer: Self-pay | Admitting: Emergency Medicine

## 2021-11-28 ENCOUNTER — Emergency Department (HOSPITAL_COMMUNITY)
Admission: EM | Admit: 2021-11-28 | Discharge: 2021-11-28 | Disposition: A | Discharge status: Home / Self Care | Payer: No Typology Code available for payment source | Attending: Emergency Medicine | Admitting: Emergency Medicine

## 2021-11-28 ENCOUNTER — Other Ambulatory Visit: Payer: Self-pay

## 2021-11-28 ENCOUNTER — Emergency Department (HOSPITAL_COMMUNITY): Payer: No Typology Code available for payment source

## 2021-11-28 DIAGNOSIS — R55 Syncope and collapse: Secondary | ICD-10-CM | POA: Insufficient documentation

## 2021-11-28 DIAGNOSIS — W19XXXA Unspecified fall, initial encounter: Secondary | ICD-10-CM | POA: Diagnosis not present

## 2021-11-28 DIAGNOSIS — M25561 Pain in right knee: Secondary | ICD-10-CM | POA: Insufficient documentation

## 2021-11-28 DIAGNOSIS — Z79899 Other long term (current) drug therapy: Secondary | ICD-10-CM | POA: Diagnosis not present

## 2021-11-28 LAB — CBC WITH DIFFERENTIAL/PLATELET
Abs Immature Granulocytes: 0.01 10*3/uL (ref 0.00–0.07)
Basophils Absolute: 0 10*3/uL (ref 0.0–0.1)
Basophils Relative: 1 %
Eosinophils Absolute: 0.1 10*3/uL (ref 0.0–0.5)
Eosinophils Relative: 2 %
HCT: 40.1 % (ref 39.0–52.0)
Hemoglobin: 14.6 g/dL (ref 13.0–17.0)
Immature Granulocytes: 0 %
Lymphocytes Relative: 35 %
Lymphs Abs: 1.9 10*3/uL (ref 0.7–4.0)
MCH: 32.5 pg (ref 26.0–34.0)
MCHC: 36.4 g/dL — ABNORMAL HIGH (ref 30.0–36.0)
MCV: 89.3 fL (ref 80.0–100.0)
Monocytes Absolute: 0.5 10*3/uL (ref 0.1–1.0)
Monocytes Relative: 8 %
Neutro Abs: 3 10*3/uL (ref 1.7–7.7)
Neutrophils Relative %: 54 %
Platelets: 284 10*3/uL (ref 150–400)
RBC: 4.49 MIL/uL (ref 4.22–5.81)
RDW: 12.4 % (ref 11.5–15.5)
WBC: 5.6 10*3/uL (ref 4.0–10.5)
nRBC: 0 % (ref 0.0–0.2)

## 2021-11-28 LAB — BASIC METABOLIC PANEL
Anion gap: 10 (ref 5–15)
BUN: 21 mg/dL (ref 8–23)
CO2: 24 mmol/L (ref 22–32)
Calcium: 9.4 mg/dL (ref 8.9–10.3)
Chloride: 99 mmol/L (ref 98–111)
Creatinine, Ser: 1.06 mg/dL (ref 0.61–1.24)
GFR, Estimated: >60 mL/min (ref >60)
Glucose, Bld: 97 mg/dL (ref 70–99)
Potassium: 3.7 mmol/L (ref 3.5–5.1)
Sodium: 133 mmol/L — ABNORMAL LOW (ref 135–145)

## 2021-11-28 LAB — CBG MONITORING, ED: Glucose-Capillary: 91 mg/dL (ref 70–99)

## 2021-11-28 MED ORDER — SODIUM CHLORIDE 0.9 % IV BOLUS
1000.0000 mL | Freq: Once | INTRAVENOUS | Status: AC
  Administered 2021-11-28: 1000 mL via INTRAVENOUS

## 2021-11-28 NOTE — Discharge Instructions (Addendum)
You were seen today for right sided knee pain. No fracture or dislocation was noted on imaging. Your workup shows no acute reason for your syncopal episode. I recommend follow up with your primary care provider. If you have additional syncopal episodes or develop other life threatening conditions such as chest pain or shortness of breath you should return to the emergency department.

## 2021-11-28 NOTE — ED Provider Notes (Signed)
[facility] DEPT Provider Note   CSN: [id number] Arrival date & time: 11/28/21  1121     History {Add pertinent medical, surgical, social history, OB history to HPI:1} Chief Complaint  Patient presents with   Knee Pain    Aaron Bowman is a 66 y.o. male.  Patient presents to the hospital complaining of right knee pain after a fall that occurred yesterday.  Patient states he was working in his yard when he had a syncopal episode.  He denies hitting his head, denies use of blood thinners.  The patient complains of pain to the posterior and medial aspects of the right knee.  He denies nausea, vomiting, abdominal pain, shortness of breath, chest pain, headache.  Past medical history significant for hypertension, GERD, arthritis, chronic hip pain, chronic right upper quadrant pain   HPI     Home Medications Prior to Admission medications   Medication Sig Start Date End Date Taking? Authorizing Provider  amLODipine (NORVASC) 5 MG tablet TAKE 1 TABLET BY MOUTH DAILY 08/31/21 08/31/22  Martinique, Betty G, MD  atorvastatin (LIPITOR) 20 MG tablet Take 1 tablet by mouth daily. 10/20/20 10/20/21  Martinique, Betty G, MD  cholecalciferol (VITAMIN D) 400 units TABS tablet Take 400 Units by mouth daily.    [provider]  diclofenac Sodium (VOLTAREN) 1 % GEL Apply 4 g topically 4 (four) times daily. 10/19/20   Martinique, Betty G, MD  ferrous sulfate 325 (65 FE) MG EC tablet Take 325 mg by mouth 1 day or 1 dose.    [provider]  lisinopril-hydrochlorothiazide (ZESTORETIC) 20-12.5 MG tablet TAKE 2 TABLETS BY MOUTH DAILY. 08/31/21 08/31/22  Martinique, Betty G, MD  Omega-3 Fatty Acids (FISH OIL) 1000 MG CAPS Take 1,000 mg by mouth.    [provider]  omeprazole (PRILOSEC) 20 MG capsule TAKE 1 CAPSULE BY MOUTH ONCE DAILY 05/06/19   Martinique, Betty G, MD  spironolactone (ALDACTONE) 25 MG tablet TAKE 1/2 TABLET BY MOUTH DAILY. 08/31/21 08/31/22  Martinique, Betty G, MD   tadalafil (CIALIS) 5 MG tablet Take 1 tablet by mouth daily 10/18/20     vitamin B-12 (CYANOCOBALAMIN) 100 MCG tablet Take 100 mcg by mouth daily.    [provider]  vitamin E 1000 UNIT capsule Take 1,000 Units by mouth daily.    [provider]      Allergies    Patient has no known allergies.    Review of Systems   Review of Systems  Constitutional:  Negative for fever.  Respiratory:  Negative for shortness of breath.   Cardiovascular:  Negative for chest pain.  Gastrointestinal:  Negative for abdominal pain, nausea and vomiting.  Musculoskeletal:  Positive for arthralgias.  Neurological:  Positive for syncope.    Physical Exam Updated Vital Signs BP 138/85 (BP Location: Left Arm)   Pulse 74   Temp 99.4 F (37.4 C) (Oral)   Resp 16   SpO2 98%  Physical Exam Vitals and nursing note reviewed.  Constitutional:      General: He is not in acute distress.    Appearance: He is normal weight.  HENT:     Head: Normocephalic and atraumatic.     Nose: Nose normal.     Mouth/Throat:     Mouth: Mucous membranes are moist.  Eyes:     Extraocular Movements: Extraocular movements intact.     Conjunctiva/sclera: Conjunctivae normal.     Pupils: Pupils are equal, round, and reactive to light.  Cardiovascular:  Rate and Rhythm: Normal rate and regular rhythm.     Pulses: Normal pulses.     Heart sounds: Normal heart sounds.  Pulmonary:     Effort: Pulmonary effort is normal.     Breath sounds: Normal breath sounds.  Abdominal:     Palpations: Abdomen is soft.     Tenderness: There is no abdominal tenderness.  Musculoskeletal:        General: Tenderness (Mild tenderness to palpation the posterior medial aspects of the right knee) present. No swelling or deformity. Normal range of motion.     Cervical back: Normal range of motion and neck supple.  Skin:    General: Skin is warm and dry.     Capillary Refill: Capillary refill takes less than 2 seconds.   Neurological:     General: No focal deficit present.     Mental Status: He is alert and oriented to person, place, and time.     Comments: Cranial nerves II through VII, XI, XII intact     ED Results / Procedures / Treatments   Labs (all labs ordered are listed, but only abnormal results are displayed) Labs Reviewed  BASIC METABOLIC PANEL  CBC WITH DIFFERENTIAL/PLATELET  CBG MONITORING, ED    EKG None  Radiology DG Knee Complete 4 Views Right  Result Date: 11/28/2021 CLINICAL DATA:  Trauma, fall, pain EXAM: RIGHT KNEE - COMPLETE 4+ VIEW COMPARISON:  Report for previous MR done on 07/13/2014 FINDINGS: No fracture or dislocation is seen. There is soft tissue fullness in suprapatellar bursa suggesting small to moderate effusion. Minimal bony spurs are seen in the patella. IMPRESSION: No recent fracture or dislocation is seen in right knee. Small to moderate effusion is present in suprapatellar bursa. Degenerative changes with small bony spurs are seen in the patella. Electronically Signed   By: Elmer Picker M.D.   On: 11/28/2021 12:24    Procedures Procedures  {Document cardiac monitor, telemetry assessment procedure when appropriate:1}  Medications Ordered in ED Medications  sodium chloride 0.9 % bolus 1,000 mL (has no administration in time range)    ED Course/ Medical Decision Making/ A&P                           Medical Decision Making Amount and/or Complexity of Data Reviewed Labs: ordered. Radiology: ordered. ECG/medicine tests: ordered.   This patient presents to the ED for concern of syncope, this involves an extensive number of treatment options, and is a complaint that carries with it a high risk of complications and morbidity.  The differential diagnosis includes vasovagal syncope, dehydration/orthostatic hypotension, dysrhythmia, CVA or intracranial abnormality, and others   Co morbidities that complicate the patient evaluation  History of  hypertension   Additional history obtained:   External records from outside source obtained and reviewed including notes from office visit for hypertension management   Lab Tests:  I Ordered, and personally interpreted labs.  The pertinent results include:  ***   Imaging Studies ordered:  I ordered imaging studies including ***  I independently visualized and interpreted imaging which showed *** I agree with the radiologist interpretation   Cardiac Monitoring: / EKG:  The patient was maintained on a cardiac monitor.  I personally viewed and interpreted the cardiac monitored which showed an underlying rhythm of: ***   Consultations Obtained:  I requested consultation with the ***,  and discussed lab and imaging findings as well as pertinent plan - they recommend: ***  Problem List / ED Course / Critical interventions / Medication management   I ordered medication including normal saline  for hydration  Reevaluation of the patient after these medicines showed that the patient {resolved/improved/worsened:23923::"improved"} I have reviewed the patients home medicines and have made adjustments as needed   Social Determinants of Health:  ***   Test / Admission - Considered:  Workup benign, return precautions   {Document critical care time when appropriate:1} {Document review of labs and clinical decision tools ie heart score, Chads2Vasc2 etc:1}  {Document your independent review of radiology images, and any outside records:1} {Document your discussion with family members, caretakers, and with consultants:1} {Document social determinants of health affecting pt's care:1} {Document your decision making why or why not admission, treatments were needed:1} Final Clinical Impression(s) / ED Diagnoses Final diagnoses:  None    Rx / DC Orders ED Discharge Orders     None

## 2021-11-28 NOTE — ED Triage Notes (Signed)
Patient reports falling yesterday while outside gardening, hitting his right knee. He reports pain with ambulation. No obvious deformity or swelling.

## 2021-11-30 NOTE — Progress Notes (Unsigned)
HPI: [name] is a 66 y.o.male here today for his routine physical examination.  Last CPE: 10/19/20  He is exercising regularly, walking a few times per week and going to the gym. Following a healthful diet for the past few months, he has lost wt as a result. Stopped sugar intake and decreased fat.  Chronic medical problems: Prediabetes,HTN,HLD,B12 def, BPH,and aortic atherosclerosis among some.  Immunization History  Administered Date(s) Administered   Influenza,inj,Quad PF,6+ Mos 04/20/2016, 05/14/2017, 01/23/2018, 12/10/2018, 03/17/2020   Influenza-Unspecified 01/03/2013   Janssen (J&J) SARS-COV-2 Vaccination 06/14/2019   Td 06/18/2007   Tdap 07/13/2009, 01/03/2013, 06/27/2018   Zoster Recombinat (Shingrix) 09/16/2019, 11/17/2019   Health Maintenance  Topic Date Due   INFLUENZA VACCINE  11/08/2021   COVID-19 Vaccine (2 - Booster for Janssen series) 12/18/2021 (Originally 08/09/2019)   COLONOSCOPY (Pts 45-72yr Insurance coverage will need to be confirmed)  04/10/2026   TETANUS/TDAP  06/26/2028   Hepatitis C Screening  Completed   HIV Screening  Completed   Zoster Vaccines- Shingrix  Completed   HPV VACCINES  Aged Out   Last prostate ca screening:  Nocturia x 1-2, depending or water intake. Lab Results  Component Value Date   PSA1 1.5 05/14/2017   -No hx of tobacco use.  -Concerns and/or follow up today:  HTN: BP has been low. [place] and taking daily sublingual supplement+ fish oil. Spironolactone was discontinued. A couple days ago he was instructed to decreased Lisinopril-HCTZ dose from 1 tab to 2 tabs.He is also taking Amlodipine 5 mg daily. Evaluated in the ED on 11/28/21 for episode of syncope and collapse episode.He was working outdoors,digging a hole, felt dizzy and 30 min later passed out. He was drinking water. LOC for about 1-2 minutes and his wife reported that he rolled his eyes  and had generalized body movement, no associated bladder or bowel incontinence.He felt fatigue after episodes,no confusion or headache. No other associated symptom.  The night before he was bitten by a wasp, took Benadryl 50 mg. His wife has noted generalized sudden movements ,intermittently while he is asleep. Lab Results  Component Value Date   CREATININE 1.06 11/28/2021   BUN 21 11/28/2021   NA 133 (L) 11/28/2021   K 3.7 11/28/2021   CL 99 11/28/2021   CO2 24 11/28/2021   HLD: He is on Atorvastatin 20 mg daily. Lab Results  Component Value Date   CHOL 148 10/19/2020   HDL 39.00 (L) 10/19/2020   LDLCALC 87 10/19/2020   LDLDIRECT 157.6 06/25/2007   TRIG 111.0 10/19/2020   CHOLHDL 4 10/19/2020   Review of Systems  Constitutional:  Positive for fatigue. Negative for activity change, appetite change and fever.  HENT:  Negative for mouth sores, nosebleeds, sore throat and trouble swallowing.   Eyes:  Negative for redness and visual disturbance.  Respiratory:  Negative for cough, shortness of breath and wheezing.   Cardiovascular:  Negative for chest pain, palpitations and leg swelling.  Gastrointestinal:  Negative for abdominal pain, nausea and vomiting.  Endocrine: Negative for cold intolerance, heat intolerance, polydipsia, polyphagia and polyuria.  Genitourinary:  Negative for decreased urine volume, dysuria and hematuria.  Musculoskeletal:  Negative for gait problem and myalgias.  Skin:  Negative for rash.  Neurological:  Negative for tremors, weakness and headaches.  Psychiatric/Behavioral:  Negative for confusion. The patient is not nervous/anxious.   All other systems reviewed and are negative.  Current Outpatient  Medications on File Prior to Visit  Medication Sig Dispense Refill   amLODipine (NORVASC) 5 MG tablet TAKE 1 TABLET BY MOUTH DAILY 90 tablet 2   cholecalciferol (VITAMIN D) 400 units TABS tablet Take 400 Units by mouth daily.     diclofenac Sodium (VOLTAREN) 1  % GEL Apply 4 g topically 4 (four) times daily. 150 g 2   ferrous sulfate 325 (65 FE) MG EC tablet Take 325 mg by mouth 1 day or 1 dose.     Omega-3 Fatty Acids (FISH OIL) 1000 MG CAPS Take 1,000 mg by mouth.     omeprazole (PRILOSEC) 20 MG capsule TAKE 1 CAPSULE BY MOUTH ONCE DAILY 90 capsule 1   tadalafil (CIALIS) 5 MG tablet Take 1 tablet by mouth daily 30 tablet 11   vitamin B-12 (CYANOCOBALAMIN) 100 MCG tablet Take 100 mcg by mouth daily.     vitamin E 1000 UNIT capsule Take 1,000 Units by mouth daily.     atorvastatin (LIPITOR) 20 MG tablet Take 1 tablet by mouth daily. 90 tablet 3   No current facility-administered medications on file prior to visit.   Past Medical History:  Diagnosis Date   Arthritis    GERD (gastroesophageal reflux disease)    Hypertension    Tuberculosis    History reviewed. No pertinent surgical history.  No Known Allergies  Family History  Problem Relation Age of Onset   Hypertension Mother    Cancer Sister    Cancer Brother    Cancer Brother    Social History   Socioeconomic History   Marital status: Married    Spouse name: Not on file   Number of children: Not on file   Years of education: Not on file   Highest education level: Not on file  Occupational History   Occupation: Glass blower/designer  Tobacco Use   Smoking status: Never   Smokeless tobacco: Never  Substance and Sexual Activity   Alcohol use: Yes    Alcohol/week: 5.0 standard drinks of alcohol    Types: 5 Cans of beer per week   Drug use: No   Sexual activity: Yes  Other Topics Concern   Not on file  Social History Narrative   Not on file   Social Determinants of Health   Financial Resource Strain: Not on file  Food Insecurity: Not on file  Transportation Needs: Not on file  Physical Activity: Unknown (04/18/2021)   Exercise Vital Sign    Days of Exercise per Week: Patient refused    Minutes of Exercise per Session: Not on file  Stress: Not on file  Social Connections:  Unknown (04/18/2021)   Social Connection and Isolation Panel [NHANES]    Frequency of Communication with Friends and Family: Not on file    Frequency of Social Gatherings with Friends and Family: Not on file    Attends Religious Services: Not on file    Active Member of Clubs or Organizations: Patient refused    Attends Archivist Meetings: Not on file    Marital Status: Not on file   Vitals:   12/02/21 0747  BP: 126/74  Pulse: 69  Resp: 12  Temp: 98.7 F (37.1 C)  SpO2: 99%  Body mass index is 27.25 kg/m. Wt Readings from Last 3 Encounters:  12/02/21 174 lb (78.9 kg)  04/22/21 206 lb (93.4 kg)  10/19/20 204 lb (92.5 kg)   Physical Exam Vitals and nursing note reviewed.  Constitutional:      General:  He is not in acute distress.    Appearance: He is well-developed.  HENT:     Head: Normocephalic and atraumatic.     Right Ear: Tympanic membrane, ear canal and external ear normal.     Left Ear: External ear normal.     Ears:     Comments: Excess cerumen left, TM seen partially.    Mouth/Throat:     Mouth: Mucous membranes are moist.     Pharynx: Oropharynx is clear.  Eyes:     Extraocular Movements: Extraocular movements intact.     Conjunctiva/sclera: Conjunctivae normal.     Pupils: Pupils are equal, round, and reactive to light.  Neck:     Thyroid: No thyroid mass or thyromegaly.  Cardiovascular:     Rate and Rhythm: Normal rate and regular rhythm.     Pulses:          Dorsalis pedis pulses are 2+ on the right side and 2+ on the left side.     Heart sounds: No murmur heard. Pulmonary:     Effort: Pulmonary effort is normal. No respiratory distress.     Breath sounds: Normal breath sounds.  Abdominal:     Palpations: Abdomen is soft. There is no hepatomegaly or mass.     Tenderness: There is no abdominal tenderness.  Genitourinary:    Comments: No concerns. Musculoskeletal:        General: No tenderness.     Cervical back: Normal range of motion.      Comments: No major deformities appreciated and no signs of synovitis.  Lymphadenopathy:     Cervical: No cervical adenopathy.     Upper Body:     Right upper body: No supraclavicular adenopathy.     Left upper body: No supraclavicular adenopathy.  Skin:    General: Skin is warm.     Findings: No erythema.  Neurological:     General: No focal deficit present.     Mental Status: He is alert and oriented to person, place, and time.     Cranial Nerves: No cranial nerve deficit.     Sensory: No sensory deficit.     Gait: Gait normal.     Deep Tendon Reflexes:     Reflex Scores:      Bicep reflexes are 2+ on the right side and 2+ on the left side.      Patellar reflexes are 2+ on the right side and 2+ on the left side. Psychiatric:        Mood and Affect: Mood and affect normal.   ASSESSMENT AND PLAN:  Mr.Farmer was seen today for annual exam.  Diagnoses and all orders for this visit:  Routine general medical examination at a health care facility We discussed the importance of regular physical activity and healthy diet for prevention of chronic illness and/or complications. Preventive guidelines reviewed. Vaccination up to date. Next CPE in a year. The 10-year ASCVD risk score (Arnett DK, et al., 2019) is: 13.1%   Values used to calculate the score:     Age: 27 years     Sex: Male     Is Non-Hispanic African American: No     Diabetic: No     Tobacco smoker: No     Systolic Blood Pressure: 546 mmHg     Is BP treated: Yes     HDL Cholesterol: 39.4 mg/dL     Total Cholesterol: 161 mg/dL  Hyperlipidemia, unspecified hyperlipidemia type Continue Atorvastatin 20 mg daily and  low fat diet. Further recommendations according to FLP result.  Syncope and collapse We discussed possible etiologies, ? Vasovagal. No symptoms than suggest cardia serious problem but had some body movements,? Seizure. Brain MRI will be arranged. We discussed recommendations in regard to  driving. Instructed about warning signs.  BPH associated with nocturia Stable. He would like PSA done today.  Screening for endocrine, metabolic and immunity disorder -     Hemoglobin A1c  Essential hypertension BP adequately controlled. Continue Lisinopril-HCTZ 20-12.5 mg daily and Amlodipine 5 mg daily. Monitor BP regularly. Continue low salt diet. F/U in 3 months, before if needed.  Return in 3 months (on 03/04/2022) for HTN.  Alesha Jaffee G. Martinique, MD  [facility]. [place] office.

## 2021-12-02 ENCOUNTER — Encounter: Payer: Self-pay | Admitting: Family Medicine

## 2021-12-02 ENCOUNTER — Ambulatory Visit (INDEPENDENT_AMBULATORY_CARE_PROVIDER_SITE_OTHER): Payer: No Typology Code available for payment source | Admitting: Family Medicine

## 2021-12-02 VITALS — BP 126/74 | HR 69 | Temp 98.7°F | Resp 12 | Ht 67.0 in | Wt 174.0 lb

## 2021-12-02 DIAGNOSIS — Z130 Encounter for screening for diseases of the blood and blood-forming organs and certain disorders involving the immune mechanism: Secondary | ICD-10-CM

## 2021-12-02 DIAGNOSIS — Z0000 Encounter for general adult medical examination without abnormal findings: Secondary | ICD-10-CM | POA: Diagnosis not present

## 2021-12-02 DIAGNOSIS — N401 Enlarged prostate with lower urinary tract symptoms: Secondary | ICD-10-CM

## 2021-12-02 DIAGNOSIS — Z1329 Encounter for screening for other suspected endocrine disorder: Secondary | ICD-10-CM

## 2021-12-02 DIAGNOSIS — R351 Nocturia: Secondary | ICD-10-CM | POA: Diagnosis not present

## 2021-12-02 DIAGNOSIS — E785 Hyperlipidemia, unspecified: Secondary | ICD-10-CM | POA: Diagnosis not present

## 2021-12-02 DIAGNOSIS — Z13228 Encounter for screening for other metabolic disorders: Secondary | ICD-10-CM | POA: Diagnosis not present

## 2021-12-02 DIAGNOSIS — R55 Syncope and collapse: Secondary | ICD-10-CM | POA: Diagnosis not present

## 2021-12-02 DIAGNOSIS — I10 Essential (primary) hypertension: Secondary | ICD-10-CM | POA: Diagnosis not present

## 2021-12-02 LAB — LIPID PANEL
Cholesterol: 161 mg/dL (ref 0–200)
HDL: 39.4 mg/dL (ref >39.00)
LDL Cholesterol: 110 mg/dL — ABNORMAL HIGH (ref 0–99)
NonHDL: 122.01
Total CHOL/HDL Ratio: 4
Triglycerides: 58 mg/dL (ref 0.0–149.0)
VLDL: 11.6 mg/dL (ref 0.0–40.0)

## 2021-12-02 LAB — HEMOGLOBIN A1C: Hgb A1c MFr Bld: 5.8 % (ref 4.6–6.5)

## 2021-12-02 LAB — PSA: PSA: 1.49 ng/mL (ref 0.10–4.00)

## 2021-12-02 MED ORDER — LISINOPRIL-HYDROCHLOROTHIAZIDE 20-12.5 MG PO TABS: 1.0000 | ORAL_TABLET | Freq: Every day | ORAL | 2 refills | Status: DC

## 2021-12-02 NOTE — Assessment & Plan Note (Signed)
BP adequately controlled. Continue Lisinopril-HCTZ 20-12.5 mg daily and Amlodipine 5 mg daily. Monitor BP regularly. Continue low salt diet. F/U in 3 months, before if needed.

## 2021-12-02 NOTE — Patient Instructions (Addendum)
A few things to remember from today's visit:  Routine general medical examination at a health care facility  Hyperlipidemia, unspecified hyperlipidemia type - Plan: Lipid panel  Essential hypertension - Plan: lisinopril-hydrochlorothiazide (ZESTORETIC) 20-12.5 MG tablet  Syncope and collapse - Plan: MR Brain W Wo Contrast  BPH associated with nocturia - Plan: PSA(Must document that pt has been informed of limitations of PSA testing.)  Screening for endocrine, metabolic and immunity disorder - Plan: Hemoglobin A1c  If you need refills please call your pharmacy. Do not use My Chart to request refills or for acute issues that need immediate attention.    Please be sure medication list is accurate. If a new problem present, please set up appointment sooner than planned today.  Mantenimiento de Teacher, English as a foreign language, Male Adoptar un estilo de vida saludable y recibir atencin preventiva son importantes para promover la salud y Musician. Consulte al mdico sobre: El esquema adecuado para hacerse pruebas y exmenes peridicos. Cosas que puede hacer por su cuenta para prevenir enfermedades y Webb sano. Qu debo saber sobre la dieta, el peso y el ejercicio? Consuma una dieta saludable  Consuma una dieta que incluya muchas verduras, frutas, productos lcteos con bajo contenido de [place] y Advertising account planner. No consuma muchos alimentos ricos en grasas slidas, azcares agregados o sodio. Mantenga un peso saludable El ndice de masa muscular [facility]) es una medida que puede utilizarse para identificar posibles problemas de [place]. Proporciona una estimacin de la grasa corporal basndose en el peso y la altura. Su mdico puede ayudarle a Radiation protection practitioner [place] y a Scientist, forensic o Theatre manager un peso saludable. Haga ejercicio con regularidad Haga ejercicio con regularidad. Esta es una de las prcticas ms importantes que puede hacer por su salud. La [employer] de los adultos deben seguir  estas pautas: Optometrist, al menos, 150 minutos de actividad fsica por semana. El ejercicio debe aumentar la frecuencia cardaca y Nature conservation officer transpirar (ejercicio de intensidad moderada). Hacer ejercicios de fortalecimiento por lo [employer] por semana. Agregue esto a su plan de ejercicio de intensidad moderada. Pase menos tiempo sentado. Incluso la actividad fsica ligera puede ser beneficiosa. Controle sus niveles de colesterol y lpidos en la sangre Comience a realizarse anlisis de lpidos y Research officer, trade union en la sangre a los 52 aos y luego reptalos cada 5 aos. Es posible que Automotive engineer los niveles de colesterol con mayor frecuencia si: Sus niveles de lpidos y colesterol son altos. Es mayor de 75 aos. Presenta un alto riesgo de padecer enfermedades cardacas. Qu debo saber sobre las pruebas de deteccin del cncer? Muchos tipos de cncer pueden detectarse de manera temprana y, a menudo, pueden prevenirse. Segn su historia clnica y sus antecedentes familiares, es posible que deba realizarse pruebas de deteccin del cncer en diferentes edades. Esto puede incluir pruebas de deteccin de lo siguiente: Surveyor, minerals. Cncer de prstata. Cncer de piel. Cncer de pulmn. Qu debo saber sobre la enfermedad cardaca, la diabetes y la hipertensin arterial? Presin arterial y enfermedad cardaca La hipertensin arterial causa enfermedades cardacas y [place] el riesgo de accidente cerebrovascular. Es ms probable que esto se manifieste en las personas que tienen lecturas de presin arterial alta o tienen sobrepeso. Hable con el mdico sobre sus valores de presin arterial deseados. Hgase controlar la presin arterial: Cada 3 a 5 aos si tiene entre 18 y 17 aos. Todos los aos si es mayor de 40 aos. Si tiene entre 53 y 45 aos y es Engineer, water o Theatre stage manager  fumar, pregntele al mdico si debe realizarse una prueba de deteccin de aneurisma artico abdominal (AAA) por nica  vez. Diabetes Realcese exmenes de deteccin de la diabetes con regularidad. Este anlisis revisa el nivel de azcar en la sangre en [place]. Hgase las pruebas de deteccin: Cada tres aos despus de los 66 aos de edad si tiene un peso normal y un bajo riesgo de padecer diabetes. Con ms frecuencia y a partir de [place] edad inferior si tiene sobrepeso o un alto riesgo de padecer diabetes. Qu debo saber sobre la prevencin de infecciones? Hepatitis B Si tiene un riesgo ms alto de contraer hepatitis B, debe someterse a un examen de deteccin de este virus. Hable con el mdico para averiguar si tiene riesgo de contraer la infeccin por hepatitis B. Hepatitis C Se recomienda un anlisis de [place] para: Todos los que nacieron entre 1943 y [phone]. Todas las personas que tengan un riesgo de haber contrado hepatitis C. Enfermedades de transmisin sexual (ETS) Debe realizarse pruebas de deteccin de ITS todos los aos, incluidas la gonorrea y la clamidia, si: Es sexualmente activo y es menor de 14 aos. Es mayor de 52 aos, y Investment banker, operational informa que corre riesgo de tener este tipo de infecciones. La actividad sexual ha cambiado desde que le hicieron la ltima prueba de deteccin y tiene un riesgo mayor de Best boy clamidia o Radio broadcast assistant. Pregntele al mdico si usted tiene riesgo. Pregntele al mdico si usted tiene un alto riesgo de Museum/gallery curator VIH. El mdico tambin puede recomendarle un medicamento recetado para ayudar a evitar la infeccin por el VIH. Si elige tomar medicamentos para prevenir el VIH, primero debe [employer] de deteccin del VIH. Luego debe hacerse anlisis cada 3 meses mientras est tomando los medicamentos. Siga estas indicaciones en su casa: Consumo de alcohol No beba alcohol si el mdico se lo prohbe. Si bebe alcohol: Limite la cantidad que consume de 0 a 2 bebidas por da. Sepa cunta cantidad de alcohol hay en las bebidas que toma. En los [place], una medida  equivale a una botella de cerveza de 12 oz (355 ml), un vaso de vino de 5 oz (148 ml) o un vaso de una bebida alcohlica de alta graduacin de 1 oz (44 ml). Estilo de vida No consuma ningn producto que contenga nicotina o tabaco. Estos productos incluyen cigarrillos, tabaco para Higher education careers adviser y aparatos de vapeo, como los Psychologist, sport and exercise. Si necesita ayuda para dejar de consumir estos productos, consulte al mdico. No consuma drogas. No comparta agujas. Solicite ayuda a su mdico si necesita apoyo o informacin para abandonar las drogas. Indicaciones generales Realcese los estudios de rutina de la salud, dentales y de Public librarian. [place]. Infrmele a su mdico si: Se siente deprimido con frecuencia. Alguna vez ha sido vctima de [place] o no se siente seguro en su casa. Resumen Adoptar un estilo de vida saludable y recibir atencin preventiva son importantes para promover la salud y Musician. Siga las instrucciones del mdico acerca de una dieta saludable, el ejercicio y la realizacin de pruebas o exmenes para Engineer, building services. Siga las instrucciones del mdico con respecto al control del colesterol y la presin arterial. Esta informacin no tiene Marine scientist el consejo del mdico. Asegrese de hacerle al mdico cualquier pregunta que tenga. Document Revised: 09/01/2020 Document Reviewed: 09/01/2020 Elsevier Patient Education  [place].

## 2021-12-04 MED ORDER — ATORVASTATIN CALCIUM 20 MG PO TABS
ORAL_TABLET | Freq: Every day | ORAL | 3 refills | Status: DC
  Filled 2021-12-04: qty 90, 90d supply, fill #0

## 2021-12-05 ENCOUNTER — Other Ambulatory Visit (HOSPITAL_COMMUNITY): Payer: Self-pay

## 2021-12-16 ENCOUNTER — Ambulatory Visit
Admission: RE | Admit: 2021-12-16 | Discharge: 2021-12-16 | Disposition: A | Discharge status: Home / Self Care | Payer: No Typology Code available for payment source | Source: Ambulatory Visit | Attending: Family Medicine | Admitting: Family Medicine

## 2021-12-16 DIAGNOSIS — R55 Syncope and collapse: Secondary | ICD-10-CM

## 2021-12-16 MED ORDER — GADOBENATE DIMEGLUMINE 529 MG/ML IV SOLN
16.0000 mL | Freq: Once | INTRAVENOUS | Status: AC | PRN
  Administered 2021-12-16: 16 mL via INTRAVENOUS

## 2021-12-20 ENCOUNTER — Other Ambulatory Visit (HOSPITAL_COMMUNITY): Payer: Self-pay

## 2021-12-23 ENCOUNTER — Encounter: Payer: Self-pay | Admitting: Family Medicine

## 2022-01-05 ENCOUNTER — Other Ambulatory Visit: Payer: Self-pay | Admitting: Family Medicine

## 2022-01-05 ENCOUNTER — Other Ambulatory Visit (HOSPITAL_COMMUNITY): Payer: Self-pay

## 2022-01-05 DIAGNOSIS — I10 Essential (primary) hypertension: Secondary | ICD-10-CM

## 2022-01-06 ENCOUNTER — Other Ambulatory Visit (HOSPITAL_COMMUNITY): Payer: Self-pay

## 2022-01-06 MED ORDER — LISINOPRIL-HYDROCHLOROTHIAZIDE 20-12.5 MG PO TABS
1.0000 | ORAL_TABLET | Freq: Every day | ORAL | 2 refills | Status: DC
  Filled 2022-01-06: qty 90, 90d supply, fill #0
  Filled 2022-04-28: qty 90, 90d supply, fill #1
  Filled 2022-07-23: qty 90, 90d supply, fill #2

## 2022-03-07 ENCOUNTER — Other Ambulatory Visit (HOSPITAL_COMMUNITY): Payer: Self-pay

## 2022-03-07 ENCOUNTER — Other Ambulatory Visit: Payer: Self-pay | Admitting: Family Medicine

## 2022-03-07 DIAGNOSIS — G8929 Other chronic pain: Secondary | ICD-10-CM

## 2022-03-08 ENCOUNTER — Other Ambulatory Visit (HOSPITAL_COMMUNITY): Payer: Self-pay

## 2022-03-08 MED ORDER — LIDOCAINE 5 % EX PTCH
1.0000 | MEDICATED_PATCH | Freq: Every day | CUTANEOUS | 1 refills | Status: DC
  Filled 2022-03-08: qty 30, 30d supply, fill #0
  Filled 2022-04-29: qty 30, 30d supply, fill #1

## 2022-03-09 ENCOUNTER — Other Ambulatory Visit: Payer: Self-pay | Admitting: Family Medicine

## 2022-03-09 ENCOUNTER — Other Ambulatory Visit (HOSPITAL_COMMUNITY): Payer: Self-pay

## 2022-03-09 DIAGNOSIS — G8929 Other chronic pain: Secondary | ICD-10-CM

## 2022-03-10 ENCOUNTER — Other Ambulatory Visit (HOSPITAL_COMMUNITY): Payer: Self-pay

## 2022-03-10 MED ORDER — DICLOFENAC SODIUM 1 % EX GEL
4.0000 g | Freq: Four times a day (QID) | CUTANEOUS | 2 refills | Status: DC
  Filled 2022-03-10: qty 100, 7d supply, fill #0
  Filled 2022-05-01: qty 100, 7d supply, fill #1
  Filled 2022-07-23: qty 100, 7d supply, fill #2

## 2022-03-20 ENCOUNTER — Encounter: Payer: Self-pay | Admitting: Family Medicine

## 2022-03-20 ENCOUNTER — Other Ambulatory Visit (HOSPITAL_COMMUNITY): Payer: Self-pay

## 2022-03-20 ENCOUNTER — Ambulatory Visit (INDEPENDENT_AMBULATORY_CARE_PROVIDER_SITE_OTHER): Payer: No Typology Code available for payment source | Admitting: Family Medicine

## 2022-03-20 VITALS — BP 128/79 | HR 88 | Resp 16 | Ht 67.0 in | Wt 170.0 lb

## 2022-03-20 DIAGNOSIS — R051 Acute cough: Secondary | ICD-10-CM | POA: Diagnosis not present

## 2022-03-20 DIAGNOSIS — U071 COVID-19: Secondary | ICD-10-CM | POA: Diagnosis not present

## 2022-03-20 LAB — POC COVID19 BINAXNOW: SARS Coronavirus 2 Ag: POSITIVE — AB

## 2022-03-20 MED ORDER — NIRMATRELVIR/RITONAVIR (PAXLOVID)TABLET
3.0000 | ORAL_TABLET | Freq: Two times a day (BID) | ORAL | 0 refills | Status: AC
  Filled 2022-03-20: qty 30, 5d supply, fill #0

## 2022-03-20 MED ORDER — BENZONATATE 100 MG PO CAPS
200.0000 mg | ORAL_CAPSULE | Freq: Two times a day (BID) | ORAL | 0 refills | Status: AC | PRN
  Filled 2022-03-20: qty 40, 10d supply, fill #0

## 2022-03-20 NOTE — Patient Instructions (Signed)
A few things to remember from today's visit:  Runny nose - Plan: POC COVID-19  COVID-19 virus infection  If you neeCOVID-19 COVID-19 La enfermedad por coronavirus 2019, o COVID-19, es una infeccin causada por un nuevo coronavirus llamado SARS-CoV-2. El COVID-19 puede causar muchos sntomas. En [employer], es posible que el virus no ocasione sntomas. En otras, puede ocasionar sntomas leves o graves. Algunas personas con infeccin grave desarrollan una enfermedad grave. Cules son las causas? Esta enfermedad es causada por un virus. El virus puede estar en el aire como pequeos puntos de lquido (aerosoles) o gotitas, o puede estar en las superficies. Usted puede contagiarse con este virus: Al inspirar las gotitas de una persona infectada. Las [employer] pueden diseminarse cuando una persona respira, habla, canta, tose o estornuda. Al tocar algo, como una mesa o el picaporte de [place], que tiene el virus sobre su superficie (est contaminado) y luego tocarse la boca, nariz o los ojos. Qu incrementa el riesgo? Riesgo de infeccin: Es ms probable que se infecte con el virus del COVID-19 si: Est a menos de 1.8 m (6 pies) de una persona con COVID-19 durante 15 minutos o ms. Est cuidando a una persona que est infectada con COVID-19. Est en contacto personal estrecho con Producer, television/film/video. El contacto personal estrecho incluye abrazar, besar o compartir utensilios para comer o beber. Riesgo de enfermedad grave causada por el COVID-19: Es ms probable que se enferme gravemente por el virus de COVID-19 si: Tiene cncer. Tiene una enfermedad a largo plazo (crnica), como las siguientes: Enfermedad pulmonar crnica. Esta incluye embolia pulmonar, enfermedad pulmonar obstructiva crnica y fibrosis [place]. Enfermedad crnica que disminuye la capacidad del cuerpo para combatir las infecciones (immunodeprimido). Afecciones cardacas graves, como insuficiencia cardaca, arteriopata  coronaria o miocardiopata. Diabetes. Enfermedad renal crnica. Enfermedades hepticas. Estas incluyen cirrosis, esteatosis heptica no alcohlica, hepatopata alcohlica o hepatitis autoinmunitaria. Tiene obesidad. Est embarazada o lo estuvo recientemente. Tiene anemia drepanoctica. Cules son los signos o sntomas? Los sntomas de esta afeccin pueden ser de leves a graves. Los sntomas pueden aparecer en el trmino de 2 a [address] despus de haber estado expuesto al virus. Incluyen lo siguiente: Cristy Hilts o escalofros. [place] para respirar. Sentirse cansado o muy cansado. Dolores de [place], dolores en el cuerpo o dolores musculares. Rinitis o congestin nasal, estornudos, tos o dolor de garganta. Nueva prdida del sentido del gusto o del olfato. Esto es poco frecuente. Algunas personas tambin pueden [employer], como nuseas, vmitos o diarrea. Es posible que otras personas no tengan sntomas de COVID-19. Cmo se diagnostica? Esta afeccin se puede diagnosticar mediante el anlisis de muestras para Hydrographic surveyor el virus del COVID-19. Las pruebas ms frecuentes son la prueba PCR y la prueba de antgenos. Las pruebas pueden realizarse en el laboratorio o Financial planner. Incluyen lo siguiente: Usar un hisopo para tomar Truddie Coco de lquido de la parte posterior de la nariz y la garganta (lquido nasofarngeo), de la nariz o de la garganta. Analizar una muestra de saliva de la boca. Analizar una muestra de mucosidad expectorada de los pulmones (esputo). Cmo se trata? El tratamiento del COVID-19 depende de la gravedad de la afeccin. Los sntomas leves pueden controlarse en el hogar con reposo, lquidos y medicamentos de venta libre. Los sntomas graves pueden tratarse en una unidad de cuidados intensivos (UCI) en el hospital. El tratamiento en la UCI puede incluir lo siguiente: Oxgeno complementario. Para administrar oxgeno extra, se utiliza un tubo en la  Jaynie Collins mascarilla o una campana de oxgeno. Medicamentos. Estos pueden incluir: Antivirales, como los anticuerpos monoclonales. Estos ayudan al organismo a combatir determinados virus que pueden causar enfermedades. Antiinflamatorios, [employer] Estos disminuyen la inflamacin y deprimen el sistema inmunitario. Antitrombticos. Estos previenen o tratan los cogulos de sangre, si se forman. Plasma de convaleciente. Ayuda a reforzar el sistema inmunitario si se tiene una afeccin inmunodepresora subyacente o est recibiendo tratamientos inmunodepresores. Posicin en decbito prono. Significa que debe acostarse boca abajo. Esto ayuda a que el oxgeno [employer]. Medidas para controlar una infeccin. Si est en riesgo de padecer una enfermedad ms grave por COVID-19, su mdico puede recetarle dos anticuerpos monoclonales de accin prolongada, administrados juntos cada 6 meses. Cmo se previene? Para protegerse: Tome medicamentos preventivos (profilaxis previa a la exposicin). Puede recibir profilaxis previa a la exposicin si tiene inmunodepresin moderada o grave. Vacnese. Cualquier persona a Proofreader de los 6 meses que cumpla con las pautas puede recibir una vacuna o una serie de vacunas contra el COVID-19. Esto incluye a las mujeres embarazadas o que producen Destin (lactantes). Aplquese una dosis adicional de la vacuna contra el COVID-19 despus de su primera vacuna o serie de vacunas si tiene inmunodepresin moderada o grave. Esto se aplica si se ha sometido a un trasplante de rganos slidos o se le ha diagnosticado una enfermedad que afecta el sistema inmunitario. Debe recibir la dosis adicional 4 semanas despus de haber recibido la primera vacuna o serie de vacunas contra el COVID-19. Si recibe una vacuna de ARNm, necesitar una serie primaria de 3 dosis. Si recibe la vacuna J&J/Janssen, necesitar una serie primaria de 2 dosis, y la segunda dosis deber ser Ardelia Mems  vacuna de ARNm. Hable con su mdico sobre la posibilidad de recibir anticuerpos monoclonales experimentales. Este tratamiento est aprobado bajo autorizacin de uso de emergencia para prevenir enfermedades graves antes o despus de la exposicin al COVID-19. Pueden administrarle anticuerpos monoclonales si: Tiene inmunodepresin moderada o grave. Esto incluye los tratamientos que reducen su respuesta inmunitaria. Las personas inmunodeprimidas pueden no desarrollar proteccin contra el COVID-19 cuando se vacunan. No puede vacunarse. Es posible que no reciba una vacuna si tiene una reaccin alrgica grave a la vacuna o a sus componentes. Si no tiene Leisure centre manager. Si est en una institucin donde hay COVID-19 y: Est en contacto estrecho con una persona infectada por el virus del COVID-19. Tiene un alto riesgo de exposicin al virus del COVID-19. Est en riesgo de contraer enfermedades por las nuevas variantes del virus del COVID-19. Cmo proteger a los dems: Si tiene sntomas de COVID-19, tome medidas para evitar que el virus se propague a Producer, television/film/video. Qudese en su casa. Salga solo para recibir Geophysical data processor. No use el transporte pblico, de ser posible. No viaje mientras est enfermo. Lvese las manos frecuentemente con agua y jabn durante al menos 20 segundos. Use desinfectante para manos con alcohol si no dispone de [place] y [place]. Asegrese de que todas las personas que viven en su casa se laven bien las manos y con frecuencia. Tosa o estornude en un pauelo de papel o sobre su manga o codo. No tosa o estornude al aire ni se cubra la boca o la nariz con la [place]. Dnde obtener ms informacin Centers for Disease Control and Prevention Librarian, academic) (Centros para Building surveyor y la Prevencin de Arboriculturist): CharmCourses.be World Health Organization (Organizacin Mundial de la Salud): https://www.castaneda.info/ Solicite ayuda de inmediato si: Tiene dificultad para  respirar. Siente  dolor u opresin en el pecho. Se siente confundido. [place] uas de los dedos y los labios de color [place]. Tiene dificultad para despertarse. Los sntomas empeoran. Estos sntomas pueden Sales executive. Solicite ayuda de inmediato. Llame al 911. No espere a ver si los sntomas desaparecen. No conduzca por sus propios medios [employer] Financial. Resumen El COVID-19 es una infeccin causada por un nuevo coronavirus. En algunos casos, no hay sntomas. Otras veces, los sntomas varan de leves a graves. Algunas personas con infeccin grave por COVID-19 desarrollan una enfermedad grave. El virus que causa el COVID-19 puede transmitirse de Ardelia Mems persona a otra a travs de las gotitas o los aerosoles que se despiden al respirar, Electrical engineer, cantar, toser o estornudar. Los sntomas leves de COVID-19 pueden controlarse en el hogar con reposo, lquidos y medicamentos de venta libre. Esta informacin no tiene Marine scientist el consejo del mdico. Asegrese de hacerle al mdico cualquier pregunta que tenga. Document Revised: 03/28/2021 Document Reviewed: 03/28/2021 Elsevier Patient Education  [place]. d refills for medications you take chronically, please call your pharmacy. Do not use My Chart to request refills or for acute issues that need immediate attention. If you send a my chart message, it may take a few days to be addressed, specially if I am not in the office.  Please be sure medication list is accurate. If a new problem present, please set up appointment sooner than planned today.

## 2022-03-20 NOTE — Progress Notes (Signed)
ACUTE VISIT Chief Complaint  Patient presents with   Cough    Contact with COVID 19 infection.   HPI: Aaron Bowman is a 66 y.o. male, who is here today complaining of 4-5 days of respiratory symptoms, productive cough, body aches, fatigue, and nasal congestion.  Cough This is a new problem. The current episode started in the past 7 days. The problem has been unchanged. The cough is Productive of sputum. Associated symptoms include myalgias, nasal congestion, postnasal drip and rhinorrhea. Pertinent negatives include no chest pain, ear congestion, ear pain, fever, headaches, heartburn, hemoptysis, rash, sore throat, shortness of breath, sweats, weight loss or wheezing. Nothing aggravates the symptoms. He has tried OTC cough suppressant for the symptoms. The treatment provided mild relief.  + Anosmia and ageusia. He has been taking OTC cold and flu medication. He is father-in-law was seen today for similar symptoms, positive for COVID-19 infection. His wife has also been sick, she has an appointment in 2 days.  Review of Systems  Constitutional:  Positive for activity change, appetite change and fatigue. Negative for fever and weight loss.  HENT:  Positive for postnasal drip and rhinorrhea. Negative for ear pain and sore throat.   Respiratory:  Positive for cough. Negative for hemoptysis, shortness of breath and wheezing.   Cardiovascular:  Negative for chest pain.  Gastrointestinal:  Negative for abdominal pain, heartburn, nausea and vomiting.  Genitourinary:  Negative for decreased urine volume, dysuria and hematuria.  Musculoskeletal:  Positive for arthralgias and myalgias.  Skin:  Negative for rash.  Neurological:  Negative for syncope and headaches.  See other pertinent positives and negatives in HPI.  Current Outpatient Medications on File Prior to Visit  Medication Sig Dispense Refill   amLODipine (NORVASC) 5 MG tablet TAKE 1 TABLET BY MOUTH DAILY 90 tablet 2    atorvastatin (LIPITOR) 20 MG tablet Take 1 tablet by mouth daily. 90 tablet 3   cholecalciferol (VITAMIN D) 400 units TABS tablet Take 400 Units by mouth daily.     diclofenac Sodium (VOLTAREN) 1 % GEL Apply 4 g topically 4 (four) times daily. 100 g 2   ferrous sulfate 325 (65 FE) MG EC tablet Take 325 mg by mouth 1 day or 1 dose.     lidocaine (LIDODERM) 5 % PLACE 1 PATCH ONTO THE SKIN DAILY. REMOVE & DISCARD PATCH WITHIN 12 HOURS OR AS DIRECTED BY MD 30 patch 1   lisinopril-hydrochlorothiazide (ZESTORETIC) 20-12.5 MG tablet Take 1 tablet by mouth daily. 90 tablet 2   Omega-3 Fatty Acids (FISH OIL) 1000 MG CAPS Take 1,000 mg by mouth.     omeprazole (PRILOSEC) 20 MG capsule TAKE 1 CAPSULE BY MOUTH ONCE DAILY 90 capsule 1   tadalafil (CIALIS) 5 MG tablet Take 1 tablet by mouth daily 30 tablet 11   vitamin B-12 (CYANOCOBALAMIN) 100 MCG tablet Take 100 mcg by mouth daily.     vitamin E 1000 UNIT capsule Take 1,000 Units by mouth daily.     No current facility-administered medications on file prior to visit.    Past Medical History:  Diagnosis Date   Arthritis    GERD (gastroesophageal reflux disease)    Hypertension    Tuberculosis    No Known Allergies  Social History   Socioeconomic History   Marital status: Married    Spouse name: Not on file   Number of children: Not on file   Years of education: Not on file   Highest education level: Not on file  Occupational History   Occupation: Glass blower/designer  Tobacco Use   Smoking status: Never   Smokeless tobacco: Never  Substance and Sexual Activity   Alcohol use: Yes    Alcohol/week: 5.0 standard drinks of alcohol    Types: 5 Cans of beer per week   Drug use: No   Sexual activity: Yes  Other Topics Concern   Not on file  Social History Narrative   Not on file   Social Determinants of Health   Financial Resource Strain: Not on file  Food Insecurity: Not on file  Transportation Needs: Not on file  Physical Activity:  Unknown (04/18/2021)   Exercise Vital Sign    Days of Exercise per Week: Patient refused    Minutes of Exercise per Session: Not on file  Stress: Not on file  Social Connections: Unknown (04/18/2021)   Social Connection and Isolation Panel [NHANES]    Frequency of Communication with Friends and Family: Not on file    Frequency of Social Gatherings with Friends and Family: Not on file    Attends Religious Services: Not on file    Active Member of Clubs or Organizations: Patient refused    Attends Archivist Meetings: Not on file    Marital Status: Not on file   Vitals:   03/20/22 1052  BP: 128/79  Pulse: 88  Resp: 16  Body mass index is 26.63 kg/m.  Physical Exam Vitals and nursing note reviewed.  Constitutional:      General: He is not in acute distress.    Appearance: He is well-developed. He is not ill-appearing.  HENT:     Head: Normocephalic and atraumatic.     Right Ear: Tympanic membrane, ear canal and external ear normal.     Left Ear: External ear normal. Tympanic membrane is not erythematous.     Ears:     Comments: Excess cerumen left ear canal, TM seen partially.    Nose: Congestion and rhinorrhea present.     Mouth/Throat:     Mouth: Mucous membranes are moist.     Pharynx: Oropharynx is clear. Uvula midline. No posterior oropharyngeal erythema.  Eyes:     Conjunctiva/sclera: Conjunctivae normal.  Cardiovascular:     Rate and Rhythm: Normal rate and regular rhythm.     Heart sounds: No murmur heard. Pulmonary:     Effort: Pulmonary effort is normal. No respiratory distress.     Breath sounds: Normal breath sounds. No stridor.  Lymphadenopathy:     Cervical: No cervical adenopathy.  Skin:    General: Skin is warm.     Findings: No erythema or rash.  Neurological:     Mental Status: He is alert and oriented to person, place, and time.  Psychiatric:        Mood and Affect: Mood and affect normal.   ASSESSMENT AND PLAN:  Aaron Bowman was seen today  for cough.  Diagnoses and all orders for this visit:  COVID-19 virus infection We discussed Dx,possible complications and treatment options. He has a mild to moderate case with risk for complications. We discussed oral antiviral options and side effects. He agrees with trying Paxlovid, he needs to start medication today.. Symptomatic treatment with plenty of fluids,rest,tylenol 500 mg 3-4 times per day prn. Continue adequate hydration. Complete 7 days of quarantine. Instructed about warning signs.  -     nirmatrelvir/ritonavir EUA (PAXLOVID) 20 x 150 MG & 10 x '100MG'$  TABS; Take 3 tablets by mouth 2 times daily for 5  days.  Acute cough Benzonatate for symptomatic treatment. Cough and congestion may last a few more days and even weeks after acute symptoms have resolved. I do not think imaging is needed at this time. Follow-up as needed.  -     POC COVID-19 -     benzonatate (TESSALON) 100 MG capsule; Take 2 capsules (200 mg total) by mouth 2 (two) times daily as needed for up to 10 days.  Return if symptoms worsen or fail to improve, for keep next appointment.  Bernal Luhman G. [place], MD  [facility]. [place] office.

## 2022-03-23 ENCOUNTER — Other Ambulatory Visit (HOSPITAL_COMMUNITY): Payer: Self-pay

## 2022-03-28 ENCOUNTER — Other Ambulatory Visit (HOSPITAL_COMMUNITY): Payer: Self-pay

## 2022-04-19 ENCOUNTER — Ambulatory Visit (INDEPENDENT_AMBULATORY_CARE_PROVIDER_SITE_OTHER): Payer: 59

## 2022-04-19 DIAGNOSIS — Z23 Encounter for immunization: Secondary | ICD-10-CM

## 2022-05-01 ENCOUNTER — Other Ambulatory Visit (HOSPITAL_COMMUNITY): Payer: Self-pay

## 2022-07-23 ENCOUNTER — Other Ambulatory Visit (HOSPITAL_COMMUNITY): Payer: Self-pay

## 2022-07-23 ENCOUNTER — Other Ambulatory Visit: Payer: Self-pay | Admitting: Family Medicine

## 2022-07-23 DIAGNOSIS — I10 Essential (primary) hypertension: Secondary | ICD-10-CM

## 2022-07-24 ENCOUNTER — Other Ambulatory Visit (HOSPITAL_COMMUNITY): Payer: Self-pay

## 2022-07-24 ENCOUNTER — Other Ambulatory Visit: Payer: Self-pay

## 2022-07-24 MED ORDER — AMLODIPINE BESYLATE 5 MG PO TABS
5.0000 mg | ORAL_TABLET | Freq: Every day | ORAL | 1 refills | Status: DC
  Filled 2022-07-24: qty 90, 90d supply, fill #0
  Filled 2022-11-16: qty 90, 90d supply, fill #1

## 2022-11-16 ENCOUNTER — Other Ambulatory Visit: Payer: Self-pay | Admitting: Family Medicine

## 2022-11-16 DIAGNOSIS — I10 Essential (primary) hypertension: Secondary | ICD-10-CM

## 2022-11-17 ENCOUNTER — Other Ambulatory Visit: Payer: Self-pay | Admitting: Family Medicine

## 2022-11-17 ENCOUNTER — Other Ambulatory Visit (HOSPITAL_COMMUNITY): Payer: Self-pay

## 2022-11-17 DIAGNOSIS — I10 Essential (primary) hypertension: Secondary | ICD-10-CM

## 2022-11-18 ENCOUNTER — Other Ambulatory Visit (HOSPITAL_COMMUNITY): Payer: Self-pay

## 2022-11-20 ENCOUNTER — Other Ambulatory Visit: Payer: Self-pay | Admitting: Family Medicine

## 2022-11-20 ENCOUNTER — Other Ambulatory Visit (HOSPITAL_COMMUNITY): Payer: Self-pay

## 2022-11-20 DIAGNOSIS — I10 Essential (primary) hypertension: Secondary | ICD-10-CM

## 2022-11-21 ENCOUNTER — Other Ambulatory Visit (HOSPITAL_COMMUNITY): Payer: Self-pay

## 2022-11-21 MED ORDER — LISINOPRIL-HYDROCHLOROTHIAZIDE 20-12.5 MG PO TABS
1.0000 | ORAL_TABLET | Freq: Every day | ORAL | 2 refills | Status: DC
  Filled 2022-11-21: qty 90, 90d supply, fill #0
  Filled 2023-03-06: qty 90, 90d supply, fill #1
  Filled 2023-05-25: qty 90, 90d supply, fill #2

## 2022-12-06 NOTE — Progress Notes (Signed)
HPI: [name] is a 67 y.o.male with PMHx significant for prediabetes,HTN,HLD,B12 def, BPH,and aortic atherosclerosis here today for his routine physical examination.  Last CPE: 12/02/21 He reports that since his last visit he underwent tongue biopsy, which revealed a precancerous lesion.  According to patient, no further follow-up was recommended and applies a prescribed cream at night on Bx area.  He follows with his dentist regularly.  He exercises regularly, walking for one hour and ten minutes daily. He states that he has been eating a healthy diet, primarily consisting of fish, chicken, and vegetables, but has recently been consuming more rice. He denies any current alcohol consumption, only occasionally drinking during social gatherings, and has never smoked.  He sleeps for approximately 8 hours per night. He engages in various activities for leisure, including helping with household chores, playing soccer, and solving sudoku puzzles online.  Immunization History  Administered Date(s) Administered   Influenza,inj,Quad PF,6+ Mos 04/20/2016, 05/14/2017, 01/23/2018, 12/10/2018, 03/17/2020, 04/19/2022   Influenza-Unspecified 01/03/2013   Janssen (J&J) SARS-COV-2 Vaccination 06/14/2019   PNEUMOCOCCAL CONJUGATE-20 12/08/2022   Td 06/18/2007   Tdap 07/13/2009, 01/03/2013, 06/27/2018   Zoster Recombinant(Shingrix) 09/16/2019, 11/17/2019    Health Maintenance  Topic Date Due   INFLUENZA VACCINE  11/09/2022   COVID-19 Vaccine (2 - 2023-24 season) 12/24/2022 (Originally 12/09/2021)   Colonoscopy  04/10/2026   DTaP/Tdap/Td (5 - Td or Tdap) 06/26/2028   Pneumonia Vaccine 60+ Years old  Completed   Hepatitis C Screening  Completed   HIV Screening  Completed   Zoster Vaccines- Shingrix  Completed   HPV VACCINES  Aged Out   He sees his urologist annually, last visit in 10/2022.  Hyperlipidemia and aortic atherosclerosis seen on prior imaging. He stopped taking atorvastatin last  year on the advice of a [place] provider.  Lab Results  Component Value Date   CHOL 161 12/02/2021   HDL 39.40 12/02/2021   LDLCALC 110 (H) 12/02/2021   LDLDIRECT 157.6 06/25/2007   TRIG 58.0 12/02/2021   CHOLHDL 4 12/02/2021   HTN:He has been monitoring his blood pressure at home, reporting normal readings of 120/81, 120/78. He is currently taking amlodipine 5 mg daily and lisinopril-hydrochlorothiazide 20-12.5 mg one tablet daily.  Lab Results  Component Value Date   NA 133 (L) 11/28/2021   CL 99 11/28/2021   K 3.7 11/28/2021   CO2 24 11/28/2021   BUN 21 11/28/2021   CREATININE 1.06 11/28/2021   GFRNONAA >60 11/28/2021   CALCIUM 9.4 11/28/2021   ALBUMIN 4.4 09/16/2019   GLUCOSE 97 11/28/2021   He has not been taking vitamin D daily but plans to resume doing so. He is not currently taking vitamin B12 but intends to start. Lab Results  Component Value Date   VITAMINB12 565 09/16/2019   Lab Results  Component Value Date   HGBA1C 5.8 12/02/2021   Review of Systems  Constitutional:  Negative for activity change, appetite change and fever.  HENT:  Negative for nosebleeds, sore throat and trouble swallowing.   Eyes:  Negative for redness and visual disturbance.  Respiratory:  Negative for cough, shortness of breath and wheezing.   Cardiovascular:  Negative for chest pain, palpitations and leg swelling.  Gastrointestinal:  Negative for abdominal pain, blood in stool, nausea and vomiting.  Endocrine: Negative for cold intolerance, heat intolerance, polydipsia, polyphagia and polyuria.  Genitourinary:  Negative for decreased urine volume, dysuria, genital sores, hematuria and testicular pain.  Musculoskeletal:  Negative for gait problem and myalgias.  Skin:  Negative for color change and rash.  Allergic/Immunologic: Negative for environmental allergies.  Neurological:  Negative for dizziness, syncope, weakness, numbness and headaches.  Hematological:  Negative for  adenopathy. Does not bruise/bleed easily.  Psychiatric/Behavioral:  Negative for confusion and sleep disturbance. The patient is not nervous/anxious.    Current Outpatient Medications on File Prior to Visit  Medication Sig Dispense Refill   amLODipine (NORVASC) 5 MG tablet Take 1 tablet (5 mg total) by mouth daily. 90 tablet 1   cholecalciferol (VITAMIN D) 400 units TABS tablet Take 400 Units by mouth daily.     diclofenac Sodium (VOLTAREN) 1 % GEL Apply 4 grams topically as directed  4  times daily. 100 g 2   ferrous sulfate 325 (65 FE) MG EC tablet Take 325 mg by mouth 1 day or 1 dose.     lidocaine (LIDODERM) 5 % PLACE 1 PATCH ONTO THE SKIN DAILY. REMOVE & DISCARD PATCH WITHIN 12 HOURS OR AS DIRECTED BY MD 30 patch 1   lisinopril-hydrochlorothiazide (ZESTORETIC) 20-12.5 MG tablet Take 1 tablet by mouth daily. 90 tablet 2   Omega-3 Fatty Acids (FISH OIL) 1000 MG CAPS Take 1,000 mg by mouth.     tadalafil (CIALIS) 5 MG tablet Take 1 tablet by mouth daily 30 tablet 11   vitamin B-12 (CYANOCOBALAMIN) 100 MCG tablet Take 100 mcg by mouth daily.     vitamin E 1000 UNIT capsule Take 1,000 Units by mouth daily.     No current facility-administered medications on file prior to visit.   Past Medical History:  Diagnosis Date   Arthritis    GERD (gastroesophageal reflux disease)    Hypertension    Tuberculosis    History reviewed. No pertinent surgical history.  No Known Allergies  Family History  Problem Relation Age of Onset   Hypertension Mother    Cancer Sister    Cancer Brother    Cancer Brother    Social History   Socioeconomic History   Marital status: Married    Spouse name: Not on file   Number of children: Not on file   Years of education: Not on file   Highest education level: GED or equivalent  Occupational History   Occupation: Location manager  Tobacco Use   Smoking status: Never   Smokeless tobacco: Never  Substance and Sexual Activity   Alcohol use: Yes     Alcohol/week: 5.0 standard drinks of alcohol    Types: 5 Cans of beer per week   Drug use: No   Sexual activity: Yes  Other Topics Concern   Not on file  Social History Narrative   Not on file   Social Determinants of Health   Financial Resource Strain: Patient Declined (12/04/2022)   Overall Financial Resource Strain (CARDIA)    Difficulty of Paying Living Expenses: Patient declined  Food Insecurity: Patient Declined (12/04/2022)   Hunger Vital Sign    Worried About Running Out of Food in the Last Year: Patient declined    Ran Out of Food in the Last Year: Patient declined  Transportation Needs: Unknown (12/04/2022)   PRAPARE - Administrator, Civil Service (Medical): Patient declined    Lack of Transportation (Non-Medical): No  Physical Activity: Unknown (12/04/2022)   Exercise Vital Sign    Days of Exercise per Week: Patient declined    Minutes of Exercise per Session: Not on file  Stress: No Stress Concern Present (12/04/2022)   [employer] of Occupational Health -  Occupational Stress Questionnaire    Feeling of Stress : Not at all  Social Connections: Moderately Integrated (12/04/2022)   Social Connection and Isolation Panel [NHANES]    Frequency of Communication with Friends and Family: More than three times a week    Frequency of Social Gatherings with Friends and Family: Three times a week    Attends Religious Services: More than 4 times per year    Active Member of Clubs or Organizations: No    Attends Banker Meetings: Not on file    Marital Status: Married    Vitals:   12/08/22 0718  BP: 120/80  Pulse: 62  Resp: 16  Temp: 98.5 F (36.9 C)  SpO2: 98%   Body mass index is 28.09 kg/m.  Wt Readings from Last 3 Encounters:  12/08/22 179 lb 6 oz (81.4 kg)  03/20/22 170 lb (77.1 kg)  12/02/21 174 lb (78.9 kg)   Physical Exam Vitals and nursing note reviewed.  Constitutional:      General: He is not in acute distress.     Appearance: He is well-developed.  HENT:     Head: Normocephalic and atraumatic.     Right Ear: Tympanic membrane, ear canal and external ear normal.     Left Ear: External ear normal.     Ears:     Comments: There is excess cerumen left, could not see TM.    Mouth/Throat:     Mouth: Mucous membranes are moist.     Pharynx: Oropharynx is clear.  Eyes:     Extraocular Movements: Extraocular movements intact.     Conjunctiva/sclera: Conjunctivae normal.     Pupils: Pupils are equal, round, and reactive to light.  Neck:     Thyroid: No thyroid mass or thyromegaly.  Cardiovascular:     Rate and Rhythm: Normal rate and regular rhythm.     Pulses:          Dorsalis pedis pulses are 2+ on the right side and 2+ on the left side.     Heart sounds: No murmur heard. Pulmonary:     Effort: Pulmonary effort is normal. No respiratory distress.     Breath sounds: Normal breath sounds.  Abdominal:     Palpations: Abdomen is soft. There is no hepatomegaly or mass.     Tenderness: There is no abdominal tenderness.  Genitourinary:    Comments: No concerns. Musculoskeletal:        General: No tenderness.     Cervical back: Normal range of motion.     Comments: No major deformities appreciated and no signs of synovitis.  Lymphadenopathy:     Cervical: No cervical adenopathy.     Upper Body:     Right upper body: No supraclavicular adenopathy.     Left upper body: No supraclavicular adenopathy.  Skin:    General: Skin is warm.     Findings: No erythema.  Neurological:     General: No focal deficit present.     Mental Status: He is alert and oriented to person, place, and time.     Cranial Nerves: No cranial nerve deficit.     Sensory: No sensory deficit.     Gait: Gait normal.     Deep Tendon Reflexes:     Reflex Scores:      Bicep reflexes are 2+ on the right side and 2+ on the left side.      Patellar reflexes are 2+ on the right side and 2+ on  the left side. Psychiatric:        Mood  and Affect: Mood and affect normal.   ASSESSMENT AND PLAN:  Mr. Dmontae was seen today for annual exam.  Diagnoses and all orders for this visit: Orders Placed This Encounter  Procedures   Pneumococcal conjugate vaccine 20-valent (Prevnar 20)   VITAMIN D 25 Hydroxy (Vit-D Deficiency, Fractures)   Vitamin B12   Comprehensive metabolic panel   Hemoglobin A1c   Lipid panel   Ambulatory referral to Gastroenterology   Routine general medical examination at a health care facility Assessment & Plan: We discussed the importance of regular physical activity and healthy diet for prevention of chronic illness and/or complications. Preventive guidelines reviewed. Vaccination updated, Prevnar 20 given today. Next CPE in a year.   Hyperlipidemia, unspecified hyperlipidemia type Assessment & Plan: He is no longer on atorvastatin. For now continue nonpharmacologic treatment, recommendations in regard to resuming medication will be given according to lipid panel result. We discussed CV benefits of statin meds.  Orders: -     Comprehensive metabolic panel; Future -     Lipid panel; Future  Essential hypertension Assessment & Plan: BP adequately controlled. Continue lisinopril-HCTZ 20-12.5 mg daily and amlodipine 5 mg daily. Continue low-salt diet and monitoring BP regularly. Eye exam is current. As far as problem is stable, annual follow-up can be continued.  Orders: -     Comprehensive metabolic panel; Future  Prediabetes Assessment & Plan: Encourage consistency with a healthy lifestyle. Further recommendation will be given according to hemoglobin A1c result.  Orders: -     Hemoglobin A1c; Future  B12 deficiency Assessment & Plan: Currently he is not on B12 supplementation. Further recommendation will be given according to B12 result.  Orders: -     Vitamin B12; Future  Vitamin D deficiency, unspecified Assessment & Plan: Currently he is not on vitamin D  supplementation. Further recommendation will be given according to 25 [place] vitamin D result.  Orders: -     VITAMIN D 25 Hydroxy (Vit-D Deficiency, Fractures); Future  Colon cancer screening -     Ambulatory referral to Gastroenterology  Atherosclerosis of aorta [facility]) Assessment & Plan: Seen on abdominal CT in 03/2020. Currently he is not on statin. Further recommendation will be given according to lipid panel result.   Need for pneumococcal 20-valent conjugate vaccination -     Pneumococcal conjugate vaccine 20-valent   Return in 1 year (on 12/08/2023) for Labs, chronic problems, CPE.  Demonte Dobratz G. [place], MD  [facility]. [place] office.

## 2022-12-08 ENCOUNTER — Other Ambulatory Visit (HOSPITAL_COMMUNITY): Payer: Self-pay

## 2022-12-08 ENCOUNTER — Ambulatory Visit (INDEPENDENT_AMBULATORY_CARE_PROVIDER_SITE_OTHER): Payer: 59 | Admitting: Family Medicine

## 2022-12-08 ENCOUNTER — Encounter: Payer: Self-pay | Admitting: Family Medicine

## 2022-12-08 VITALS — BP 120/80 | HR 62 | Temp 98.5°F | Resp 16 | Ht 67.0 in | Wt 179.4 lb

## 2022-12-08 DIAGNOSIS — R7303 Prediabetes: Secondary | ICD-10-CM | POA: Diagnosis not present

## 2022-12-08 DIAGNOSIS — E785 Hyperlipidemia, unspecified: Secondary | ICD-10-CM

## 2022-12-08 DIAGNOSIS — E538 Deficiency of other specified B group vitamins: Secondary | ICD-10-CM | POA: Diagnosis not present

## 2022-12-08 DIAGNOSIS — Z1211 Encounter for screening for malignant neoplasm of colon: Secondary | ICD-10-CM

## 2022-12-08 DIAGNOSIS — Z0000 Encounter for general adult medical examination without abnormal findings: Secondary | ICD-10-CM | POA: Insufficient documentation

## 2022-12-08 DIAGNOSIS — E559 Vitamin D deficiency, unspecified: Secondary | ICD-10-CM | POA: Diagnosis not present

## 2022-12-08 DIAGNOSIS — I10 Essential (primary) hypertension: Secondary | ICD-10-CM | POA: Diagnosis not present

## 2022-12-08 DIAGNOSIS — N401 Enlarged prostate with lower urinary tract symptoms: Secondary | ICD-10-CM

## 2022-12-08 DIAGNOSIS — I700 Atherosclerosis of aorta: Secondary | ICD-10-CM

## 2022-12-08 DIAGNOSIS — Z23 Encounter for immunization: Secondary | ICD-10-CM | POA: Diagnosis not present

## 2022-12-08 LAB — COMPREHENSIVE METABOLIC PANEL WITH GFR
ALT: 18 U/L (ref 0–53)
AST: 22 U/L (ref 0–37)
Albumin: 4.2 g/dL (ref 3.5–5.2)
Alkaline Phosphatase: 57 U/L (ref 39–117)
BUN: 15 mg/dL (ref 6–23)
CO2: 29 meq/L (ref 19–32)
Calcium: 9.3 mg/dL (ref 8.4–10.5)
Chloride: 103 meq/L (ref 96–112)
Creatinine, Ser: 1.06 mg/dL (ref 0.40–1.50)
GFR: 73.66 mL/min
Glucose, Bld: 85 mg/dL (ref 70–99)
Potassium: 4.1 meq/L (ref 3.5–5.1)
Sodium: 140 meq/L (ref 135–145)
Total Bilirubin: 0.5 mg/dL (ref 0.2–1.2)
Total Protein: 7.7 g/dL (ref 6.0–8.3)

## 2022-12-08 LAB — LIPID PANEL
Cholesterol: 183 mg/dL (ref 0–200)
HDL: 35.6 mg/dL — ABNORMAL LOW
LDL Cholesterol: 128 mg/dL — ABNORMAL HIGH (ref 0–99)
NonHDL: 147.81
Total CHOL/HDL Ratio: 5
Triglycerides: 101 mg/dL (ref 0.0–149.0)
VLDL: 20.2 mg/dL (ref 0.0–40.0)

## 2022-12-08 LAB — VITAMIN B12: Vitamin B-12: 189 pg/mL — ABNORMAL LOW (ref 211–911)

## 2022-12-08 LAB — VITAMIN D 25 HYDROXY (VIT D DEFICIENCY, FRACTURES): VITD: 30.75 ng/mL (ref 30.00–100.00)

## 2022-12-08 LAB — HEMOGLOBIN A1C: Hgb A1c MFr Bld: 5.6 % (ref 4.6–6.5)

## 2022-12-08 MED ORDER — ATORVASTATIN CALCIUM 10 MG PO TABS
10.0000 mg | ORAL_TABLET | Freq: Every day | ORAL | 3 refills | Status: DC
Start: 2022-12-08 — End: 2024-01-15
  Filled 2022-12-08: qty 90, 90d supply, fill #0

## 2022-12-08 NOTE — Assessment & Plan Note (Signed)
BP adequately controlled. Continue lisinopril-HCTZ 20-12.5 mg daily and amlodipine 5 mg daily. Continue low-salt diet and monitoring BP regularly. Eye exam is current. As far as problem is stable, annual follow-up can be continued.

## 2022-12-08 NOTE — Assessment & Plan Note (Signed)
Seen on abdominal CT in 03/2020. Currently he is not on statin. Further recommendation will be given according to lipid panel result.

## 2022-12-08 NOTE — Assessment & Plan Note (Signed)
He is no longer on atorvastatin. For now continue nonpharmacologic treatment, recommendations in regard to resuming medication will be given according to lipid panel result. We discussed CV benefits of statin meds.

## 2022-12-08 NOTE — Assessment & Plan Note (Signed)
Encourage consistency with a healthy lifestyle. Further recommendation will be given according to hemoglobin A1c result.

## 2022-12-08 NOTE — Assessment & Plan Note (Signed)
Currently he is not on vitamin D supplementation. °Further recommendation will be given according to 25 [place] vitamin D result. °

## 2022-12-08 NOTE — Assessment & Plan Note (Signed)
Currently he is not on B12 supplementation. Further recommendation will be given according to B12 result.

## 2022-12-08 NOTE — Assessment & Plan Note (Signed)
We discussed the importance of regular physical activity and healthy diet for prevention of chronic illness and/or complications. Preventive guidelines reviewed. Vaccination updated, Prevnar 20 given today. Next CPE in a year.

## 2022-12-08 NOTE — Patient Instructions (Addendum)
A few things to remember from today's visit:  Routine general medical examination at a health care facility  Hyperlipidemia, unspecified hyperlipidemia type - Plan: Comprehensive metabolic panel, Lipid panel  Essential hypertension - Plan: Comprehensive metabolic panel  Prediabetes - Plan: Hemoglobin A1c  B12 deficiency - Plan: Vitamin B12  Vitamin D deficiency, unspecified - Plan: VITAMIN D 25 Hydroxy (Vit-D Deficiency, Fractures)  Colon cancer screening - Plan: Ambulatory referral to Gastroenterology  No cambios hoy.  Do not use My Chart to request refills or for acute issues that need immediate attention. If you send a my chart message, it may take a few days to be addressed, specially if I am not in the office.  Please be sure medication list is accurate. If a new problem present, please set up appointment sooner than planned today.  Mantenimiento de Research officer, political party, Male Adoptar un estilo de vida saludable y recibir atencin preventiva son importantes para promover la salud y Counsellor. Consulte al mdico sobre: El esquema adecuado para hacerse pruebas y exmenes peridicos. Cosas que puede hacer por su cuenta para prevenir enfermedades y [place] sano. Qu debo saber sobre la dieta, el peso y el ejercicio? Consuma una dieta saludable  Consuma una dieta que incluya muchas verduras, frutas, productos lcteos con bajo contenido de [place] y Associate Professor. No consuma muchos alimentos ricos en grasas slidas, azcares agregados o sodio. Mantenga un peso saludable El ndice de masa muscular [facility]) es una medida que puede utilizarse para identificar posibles problemas de [place]. Proporciona una estimacin de la grasa corporal basndose en el peso y la altura. Su mdico puede ayudarle a Engineer, site IMC y a Personnel officer o Pharmacologist un peso saludable. Haga ejercicio con regularidad Haga ejercicio con regularidad. Esta es una de las prcticas ms importantes  que puede hacer por su salud. La [employer] de los adultos deben seguir estas pautas: Education officer, environmental, al menos, 150 minutos de actividad fsica por semana. El ejercicio debe aumentar la frecuencia cardaca y Media planner transpirar (ejercicio de intensidad moderada). Hacer ejercicios de fortalecimiento por lo [employer] por semana. Agregue esto a su plan de ejercicio de intensidad moderada. Pase menos tiempo sentado. Incluso la actividad fsica ligera puede ser beneficiosa. Controle sus niveles de colesterol y lpidos en la sangre Comience a realizarse anlisis de lpidos y Oncologist en la sangre a los 20 aos y luego reptalos cada 5 aos. Es posible que Insurance underwriter los niveles de colesterol con mayor frecuencia si: Sus niveles de lpidos y colesterol son altos. Es mayor de 40 aos. Presenta un alto riesgo de padecer enfermedades cardacas. Qu debo saber sobre las pruebas de deteccin del cncer? Muchos tipos de cncer pueden detectarse de manera temprana y, a menudo, pueden prevenirse. Segn su historia clnica y sus antecedentes familiares, es posible que deba realizarse pruebas de deteccin del cncer en diferentes edades. Esto puede incluir pruebas de deteccin de lo siguiente: Building services engineer. Cncer de prstata. Cncer de piel. Cncer de pulmn. Qu debo saber sobre la enfermedad cardaca, la diabetes y la hipertensin arterial? Presin arterial y enfermedad cardaca La hipertensin arterial causa enfermedades cardacas y [place] el riesgo de accidente cerebrovascular. Es ms probable que esto se manifieste en las personas que tienen lecturas de presin arterial alta o tienen sobrepeso. Hable con el mdico sobre sus valores de presin arterial deseados. Hgase controlar la presin arterial: Cada 3 a 5 aos si tiene entre 18 y 68 aos. [employer] si es mayor de  40 aos. Si tiene entre 65 y 56 aos y es fumador o Insurance underwriter, pregntele al mdico si debe realizarse una prueba de  deteccin de aneurisma artico abdominal (AAA) por nica vez. Diabetes Realcese exmenes de deteccin de la diabetes con regularidad. Este anlisis revisa el nivel de azcar en la sangre en [place]. Hgase las pruebas de deteccin: Cada tres aos despus de los 45 aos de edad si tiene un peso normal y un bajo riesgo de padecer diabetes. Con ms frecuencia y a partir de [place] edad inferior si tiene sobrepeso o un alto riesgo de padecer diabetes. Qu debo saber sobre la prevencin de infecciones? Hepatitis B Si tiene un riesgo ms alto de contraer hepatitis B, debe someterse a un examen de deteccin de este virus. Hable con el mdico para averiguar si tiene riesgo de contraer la infeccin por hepatitis B. Hepatitis C Se recomienda un anlisis de [place] para: Todos los que nacieron entre 1945 y [phone]. Todas las personas que tengan un riesgo de haber contrado hepatitis C. Enfermedades de transmisin sexual (ETS) Debe realizarse pruebas de deteccin de ITS todos los aos, incluidas la gonorrea y la clamidia, si: Es sexualmente activo y es menor de [address]. Es mayor de [address], y Public affairs consultant informa que corre riesgo de tener este tipo de infecciones. La actividad sexual ha cambiado desde que le hicieron la ltima prueba de deteccin y tiene un riesgo mayor de Warehouse manager clamidia o Copy. Pregntele al mdico si usted tiene riesgo. Pregntele al mdico si usted tiene un alto riesgo de Primary school teacher VIH. El mdico tambin puede recomendarle un medicamento recetado para ayudar a evitar la infeccin por el VIH. Si elige tomar medicamentos para prevenir el VIH, primero debe [employer] de deteccin del VIH. Luego debe hacerse anlisis cada 3 meses mientras est tomando los medicamentos. Siga estas indicaciones en su casa: Consumo de alcohol No beba alcohol si el mdico se lo prohbe. Si bebe alcohol: Limite la cantidad que consume de 0 a 2 bebidas por da. Sepa cunta cantidad de alcohol hay en las  bebidas que toma. En los [address], una medida equivale a una botella de cerveza de 12 oz (355 ml), un vaso de vino de 5 oz (148 ml) o un vaso de una bebida alcohlica de alta graduacin de 1 oz (44 ml). Estilo de vida No consuma ningn producto que contenga nicotina o tabaco. Estos productos incluyen cigarrillos, tabaco para Theatre manager y aparatos de vapeo, como los Administrator, Civil Service. Si necesita ayuda para dejar de consumir estos productos, consulte al mdico. No consuma drogas. No comparta agujas. Solicite ayuda a su mdico si necesita apoyo o informacin para abandonar las drogas. Indicaciones generales Realcese los estudios de rutina de [address], dentales y de Wellsite geologist. Mantngase al da con las vacunas. Infrmele a su mdico si: Se siente deprimido con frecuencia. Alguna vez ha sido vctima de [place] o no se siente seguro en su casa. Resumen Adoptar un estilo de vida saludable y recibir atencin preventiva son importantes para promover la salud y Counsellor. Siga las instrucciones del mdico acerca de una dieta saludable, el ejercicio y la realizacin de pruebas o exmenes para Hotel manager. Siga las instrucciones del mdico con respecto al control del colesterol y la presin arterial. Esta informacin no tiene Theme park manager el consejo del mdico. Asegrese de hacerle al mdico cualquier pregunta que tenga. Document Revised: 09/01/2020 Document Reviewed: 09/01/2020 Elsevier Patient Education  2024 [employer].

## 2022-12-12 ENCOUNTER — Other Ambulatory Visit (HOSPITAL_COMMUNITY): Payer: Self-pay

## 2022-12-18 ENCOUNTER — Encounter: Payer: Self-pay | Admitting: Family Medicine

## 2022-12-20 ENCOUNTER — Ambulatory Visit (INDEPENDENT_AMBULATORY_CARE_PROVIDER_SITE_OTHER): Payer: 59

## 2022-12-20 DIAGNOSIS — E538 Deficiency of other specified B group vitamins: Secondary | ICD-10-CM | POA: Diagnosis not present

## 2022-12-20 MED ORDER — CYANOCOBALAMIN 1000 MCG/ML IJ SOLN
1000.0000 ug | Freq: Once | INTRAMUSCULAR | Status: AC
Start: 2022-12-20 — End: 2022-12-20
  Administered 2022-12-20: 1000 ug via INTRAMUSCULAR

## 2022-12-20 NOTE — Progress Notes (Signed)
Per orders of Dr. [place], injection of b12 given by Kathreen Devoid. Patient tolerated injection well.

## 2022-12-27 ENCOUNTER — Ambulatory Visit (INDEPENDENT_AMBULATORY_CARE_PROVIDER_SITE_OTHER): Payer: 59

## 2022-12-27 DIAGNOSIS — E538 Deficiency of other specified B group vitamins: Secondary | ICD-10-CM | POA: Diagnosis not present

## 2022-12-27 MED ORDER — CYANOCOBALAMIN 1000 MCG/ML IJ SOLN
1000.0000 ug | Freq: Once | INTRAMUSCULAR | Status: AC
Start: 2022-12-27 — End: 2022-12-27
  Administered 2022-12-27: 1000 ug via INTRAMUSCULAR

## 2022-12-27 NOTE — Progress Notes (Signed)
Per orders of Dr. [place] , injection of B-12 given by Stann Ore. Patient tolerated injection well.

## 2023-01-26 ENCOUNTER — Ambulatory Visit (INDEPENDENT_AMBULATORY_CARE_PROVIDER_SITE_OTHER): Payer: 59

## 2023-01-26 DIAGNOSIS — E538 Deficiency of other specified B group vitamins: Secondary | ICD-10-CM

## 2023-01-26 MED ORDER — CYANOCOBALAMIN 1000 MCG/ML IJ SOLN
1000.0000 ug | Freq: Once | INTRAMUSCULAR | Status: AC
Start: 2023-01-26 — End: 2023-01-26
  Administered 2023-01-26: 1000 ug via INTRAMUSCULAR

## 2023-01-26 NOTE — Progress Notes (Signed)
Pt here for monthly B12 injection per Dr. Jordan.  B12 1000mcg given IM and pt tolerated injection well.   

## 2023-02-26 ENCOUNTER — Ambulatory Visit (INDEPENDENT_AMBULATORY_CARE_PROVIDER_SITE_OTHER): Payer: 59

## 2023-02-26 DIAGNOSIS — E538 Deficiency of other specified B group vitamins: Secondary | ICD-10-CM | POA: Diagnosis not present

## 2023-02-26 DIAGNOSIS — Z23 Encounter for immunization: Secondary | ICD-10-CM

## 2023-02-26 MED ORDER — CYANOCOBALAMIN 1000 MCG/ML IJ SOLN
1000.0000 ug | Freq: Once | INTRAMUSCULAR | Status: AC
Start: 2023-02-26 — End: 2023-02-26
  Administered 2023-02-26: 1000 ug via INTRAMUSCULAR

## 2023-02-26 NOTE — Progress Notes (Signed)
Pt here for monthly B12 injection per Dr. [place].  B12 given IM and pt tolerated injection well.  Last B12 injection scheduled for 12/18.

## 2023-03-02 ENCOUNTER — Telehealth: Payer: Self-pay | Admitting: Gastroenterology

## 2023-03-02 NOTE — Telephone Encounter (Signed)
Good morning Dr. Chales Abrahams Supervising MD AM  The following patient is being referred for a colonoscopy. He has prior GI history with [facility] and had a procedure in April 2016 and wants to be scheduled according to the referral. Records are available with care everywhere. Please review and advise of scheduling. Thank you

## 2023-03-02 NOTE — Telephone Encounter (Signed)
Colonoscopy report from April 2016 reviewed Recommends repeating in 5 years   Can proceed directly with colonoscopy with me RG

## 2023-03-06 ENCOUNTER — Other Ambulatory Visit: Payer: Self-pay

## 2023-03-06 ENCOUNTER — Other Ambulatory Visit: Payer: Self-pay | Admitting: Family Medicine

## 2023-03-06 ENCOUNTER — Other Ambulatory Visit (HOSPITAL_COMMUNITY): Payer: Self-pay

## 2023-03-06 ENCOUNTER — Encounter: Payer: Self-pay | Admitting: Gastroenterology

## 2023-03-06 DIAGNOSIS — I10 Essential (primary) hypertension: Secondary | ICD-10-CM

## 2023-03-06 MED ORDER — AMLODIPINE BESYLATE 5 MG PO TABS
5.0000 mg | ORAL_TABLET | Freq: Every day | ORAL | 3 refills | Status: AC
Start: 2023-03-06 — End: ?
  Filled 2023-03-06: qty 90, 90d supply, fill #0
  Filled 2023-05-25: qty 90, 90d supply, fill #1
  Filled 2023-09-19: qty 90, 90d supply, fill #2
  Filled 2023-12-11: qty 90, 90d supply, fill #3

## 2023-03-06 NOTE — Telephone Encounter (Signed)
PT is scheduled for colonoscopy 05/22/2023 4pm

## 2023-03-28 ENCOUNTER — Ambulatory Visit: Payer: 59

## 2023-03-28 DIAGNOSIS — E538 Deficiency of other specified B group vitamins: Secondary | ICD-10-CM

## 2023-03-28 MED ORDER — CYANOCOBALAMIN 1000 MCG/ML IJ SOLN
1000.0000 ug | Freq: Once | INTRAMUSCULAR | Status: AC
Start: 2023-03-28 — End: 2023-03-28
  Administered 2023-03-28: 1000 ug via INTRAMUSCULAR

## 2023-03-28 NOTE — Progress Notes (Signed)
Per orders of Dr. Jordan, injection of B12 given by Malayia Spizzirri E Leira Regino. Patient tolerated injection well.  

## 2023-05-04 ENCOUNTER — Other Ambulatory Visit (HOSPITAL_COMMUNITY): Payer: Self-pay

## 2023-05-04 ENCOUNTER — Ambulatory Visit (AMBULATORY_SURGERY_CENTER): Payer: Medicare PPO

## 2023-05-04 VITALS — Ht 67.0 in | Wt 181.0 lb

## 2023-05-04 DIAGNOSIS — Z1211 Encounter for screening for malignant neoplasm of colon: Secondary | ICD-10-CM

## 2023-05-04 MED ORDER — NA SULFATE-K SULFATE-MG SULF 17.5-3.13-1.6 GM/177ML PO SOLN
1.0000 | Freq: Once | ORAL | 0 refills | Status: AC
  Filled 2023-05-04: qty 354, 1d supply, fill #0
  Filled 2023-05-11: qty 354, 30d supply, fill #0

## 2023-05-04 NOTE — Progress Notes (Signed)

## 2023-05-11 ENCOUNTER — Other Ambulatory Visit (HOSPITAL_COMMUNITY): Payer: Self-pay

## 2023-05-11 ENCOUNTER — Encounter: Payer: Self-pay | Admitting: Gastroenterology

## 2023-05-14 ENCOUNTER — Other Ambulatory Visit (HOSPITAL_COMMUNITY): Payer: Self-pay

## 2023-05-22 ENCOUNTER — Encounter: Payer: Self-pay | Admitting: Gastroenterology

## 2023-05-22 ENCOUNTER — Ambulatory Visit (AMBULATORY_SURGERY_CENTER): Payer: Medicare PPO | Admitting: Gastroenterology

## 2023-05-22 VITALS — BP 107/59 | HR 59 | Temp 97.3°F | Resp 11 | Ht 67.0 in | Wt 181.0 lb

## 2023-05-22 DIAGNOSIS — I10 Essential (primary) hypertension: Secondary | ICD-10-CM | POA: Diagnosis not present

## 2023-05-22 DIAGNOSIS — Z800 Family history of malignant neoplasm of digestive organs: Secondary | ICD-10-CM

## 2023-05-22 DIAGNOSIS — K219 Gastro-esophageal reflux disease without esophagitis: Secondary | ICD-10-CM | POA: Diagnosis not present

## 2023-05-22 DIAGNOSIS — M129 Arthropathy, unspecified: Secondary | ICD-10-CM | POA: Diagnosis not present

## 2023-05-22 DIAGNOSIS — K5730 Diverticulosis of large intestine without perforation or abscess without bleeding: Secondary | ICD-10-CM

## 2023-05-22 DIAGNOSIS — N429 Disorder of prostate, unspecified: Secondary | ICD-10-CM

## 2023-05-22 DIAGNOSIS — K640 First degree hemorrhoids: Secondary | ICD-10-CM | POA: Diagnosis not present

## 2023-05-22 DIAGNOSIS — Z1211 Encounter for screening for malignant neoplasm of colon: Secondary | ICD-10-CM | POA: Diagnosis not present

## 2023-05-22 MED ORDER — SODIUM CHLORIDE 0.9 % IV SOLN: 500.0000 mL | INTRAVENOUS | Status: DC

## 2023-05-22 NOTE — Patient Instructions (Signed)
Discharge instructions given. Handouts on Diverticulosis and Hemorrhoids. Resume previous medications. YOU HAD AN ENDOSCOPIC PROCEDURE TODAY AT THE [facility]:   Refer to the procedure report that was given to you for any specific questions about what was found during the examination.  If the procedure report does not answer your questions, please call your gastroenterologist to clarify.  If you requested that your care partner not be given the details of your procedure findings, then the procedure report has been included in a sealed envelope for you to review at your convenience later.  YOU SHOULD EXPECT: Some feelings of bloating in the abdomen. Passage of more gas than usual.  Walking can help get rid of the air that was put into your GI tract during the procedure and reduce the bloating. If you had a lower endoscopy (such as a colonoscopy or flexible sigmoidoscopy) you may notice spotting of blood in your stool or on the toilet paper. If you underwent a bowel prep for your procedure, you may not have a normal bowel movement for a few days.  Please Note:  You might notice some irritation and congestion in your nose or some drainage.  This is from the oxygen used during your procedure.  There is no need for concern and it should clear up in a day or so.  SYMPTOMS TO REPORT IMMEDIATELY:  Following lower endoscopy (colonoscopy or flexible sigmoidoscopy):  Excessive amounts of blood in the stool  Significant tenderness or worsening of abdominal pains  Swelling of the abdomen that is new, acute  Fever of 100F or higher   Black, tarry-looking stools  For urgent or emergent issues, a gastroenterologist can be reached at any hour by calling [phone]. Do not use MyChart messaging for urgent concerns.    DIET:  We do recommend a small meal at first, but then you may proceed to your regular diet.  Drink plenty of fluids but you should avoid alcoholic beverages for 24  hours.  ACTIVITY:  You should plan to take it easy for the rest of today and you should NOT DRIVE or use heavy machinery until tomorrow (because of the sedation medicines used during the test).    FOLLOW UP: Our staff will call the number listed on your records the next business day following your procedure.  We will call around 7:15- 8:00 am to check on you and address any questions or concerns that you may have regarding the information given to you following your procedure. If we do not reach you, we will leave a message.     If any biopsies were taken you will be contacted by phone or by letter within the next 1-3 weeks.  Please call us at [phone] if you have not heard about the biopsies in 3 weeks.    SIGNATURES/CONFIDENTIALITY: You and/or your care partner have signed paperwork which will be entered into your electronic medical record.  These signatures attest to the fact that that the information above on your After Visit Summary has been reviewed and is understood.  Full responsibility of the confidentiality of this discharge information lies with you and/or your care-partner.

## 2023-05-22 NOTE — Op Note (Signed)
[facility] Patient Name: Aaron Bowman Procedure Date: 05/22/2023 3:25 PM MRN: [id number] Endoscopist: Lynann Bologna , MD, [id number] Age: 68 Referring MD:  Date of Birth: [birth year] Gender: Male Account #: [id number] Procedure:                Colonoscopy Indications:              Screening in patient at increased risk: Colorectal                            cancer in brother at age 2. Medicines:                Monitored Anesthesia Care Procedure:                Pre-Anesthesia Assessment:                           - Prior to the procedure, a History and Physical                            was performed, and patient medications and                            allergies were reviewed. The patient's tolerance of                            previous anesthesia was also reviewed. The risks                            and benefits of the procedure and the sedation                            options and risks were discussed with the patient.                            All questions were answered, and informed consent                            was obtained. Prior Anticoagulants: The patient has                            taken no anticoagulant or antiplatelet agents. ASA                            Grade Assessment: II - A patient with mild systemic                            disease. After reviewing the risks and benefits,                            the patient was deemed in satisfactory condition to                            undergo the procedure.  After obtaining informed consent, the colonoscope                            was passed under direct vision. Throughout the                            procedure, the patient's blood pressure, pulse, and                            oxygen saturations were monitored continuously. The                            CF [id number] #[id number] was introduced through the anus                            and advanced to the 2 cm into  the ileum. The                            colonoscopy was performed without difficulty. The                            patient tolerated the procedure well. The quality                            of the bowel preparation was good. The terminal                            ileum, ileocecal valve, appendiceal orifice, and                            rectum were photographed. Scope In: 3:30:17 PM Scope Out: 3:41:42 PM Scope Withdrawal Time: 0 hours 8 minutes 14 seconds  Total Procedure Duration: 0 hours 11 minutes 25 seconds  Findings:                 A few medium-mouthed diverticula were found in the                            sigmoid colon.                           Non-bleeding internal hemorrhoids were found during                            retroflexion. The hemorrhoids were small and Grade                            I (internal hemorrhoids that do not prolapse).                            Rectal exam did reveal enlarged prostate without                            any obvious nodules.  The terminal ileum appeared normal.                           The exam was otherwise without abnormality on                            direct and retroflexion views. Complications:            No immediate complications. Estimated Blood Loss:     Estimated blood loss: none. Impression:               - Mild sigmoid diverticulosis.                           - Non-bleeding internal hemorrhoids.                           - The examined portion of the ileum was normal.                           - The examination was otherwise normal on direct                            and retroflexion views.                           - No specimens collected. Recommendation:           - Patient has a contact number available for                            emergencies. The signs and symptoms of potential                            delayed complications were discussed with the                             patient. Return to normal activities tomorrow.                            Written discharge instructions were provided to the                            patient.                           - Resume previous diet.                           - Continue present medications.                           - Repeat colonoscopy in 5 years for screening                            purposes.                           -  If having any urinary/prostate problems, he                            should see a urologist.                           - The findings and recommendations were discussed                            with the patient's family. Lynann Bologna, MD 05/22/2023 3:46:58 PM This report has been signed electronically.

## 2023-05-22 NOTE — Progress Notes (Signed)
Sedate, gd SR, tolerated procedure well, VSS, report to RN

## 2023-05-22 NOTE — Progress Notes (Signed)
Patient states there have been no changes to medical or surgical history since time of pre-visit.

## 2023-05-22 NOTE — Progress Notes (Signed)
[facility] History and Physical   Primary Care Physician:  [place], Betty G, MD   Reason for Procedure:   FH CRC (brother at age 66)  Plan:    Colon     HPI: Aaron Bowman is a 68 y.o. male    Past Medical History:  Diagnosis Date   Arthritis    GERD (gastroesophageal reflux disease)    Hypertension     History reviewed. No pertinent surgical history.  Prior to Admission medications   Medication Sig Start Date End Date Taking? Authorizing Provider  amLODipine (NORVASC) 5 MG tablet Take 1 tablet (5 mg total) by mouth daily. 03/06/23  Yes [place], Betty G, MD  lisinopril-hydrochlorothiazide (ZESTORETIC) 20-12.5 MG tablet Take 1 tablet by mouth daily. 11/21/22  Yes [place], Betty G, MD  vitamin B-12 (CYANOCOBALAMIN) 100 MCG tablet Take 100 mcg by mouth daily.   Yes [provider]  atorvastatin (LIPITOR) 10 MG tablet Take 1 tablet (10 mg total) by mouth daily. Patient not taking: Reported on 05/22/2023 12/08/22   [place], Betty G, MD  cholecalciferol (VITAMIN D) 400 units TABS tablet Take 400 Units by mouth daily.    [provider]  diclofenac Sodium (VOLTAREN) 1 % GEL Apply 4 grams topically as directed  4  times daily. Patient not taking: Reported on 05/22/2023 03/10/22   [place], Betty G, MD  ferrous sulfate 325 (65 FE) MG EC tablet Take 325 mg by mouth 1 day or 1 dose. Patient not taking: Reported on 05/04/2023    [provider]  lidocaine (LIDODERM) 5 % PLACE 1 PATCH ONTO THE SKIN DAILY. REMOVE & DISCARD PATCH WITHIN 12 HOURS OR AS DIRECTED BY MD Patient not taking: Reported on 05/22/2023 03/08/22   [place], Betty G, MD  Omega-3 Fatty Acids (FISH OIL) 1000 MG CAPS Take 1,000 mg by mouth. Patient not taking: Reported on 05/22/2023    [provider]  tadalafil (CIALIS) 5 MG tablet Take 1 tablet by mouth daily Patient not taking: Reported on 05/22/2023 10/18/20     vitamin E 1000 UNIT capsule Take 1,000 Units by mouth daily. Patient not  taking: Reported on 05/22/2023    [provider]    Current Outpatient Medications  Medication Sig Dispense Refill   amLODipine (NORVASC) 5 MG tablet Take 1 tablet (5 mg total) by mouth daily. 90 tablet 3   lisinopril-hydrochlorothiazide (ZESTORETIC) 20-12.5 MG tablet Take 1 tablet by mouth daily. 90 tablet 2   vitamin B-12 (CYANOCOBALAMIN) 100 MCG tablet Take 100 mcg by mouth daily.     atorvastatin (LIPITOR) 10 MG tablet Take 1 tablet (10 mg total) by mouth daily. (Patient not taking: Reported on 05/22/2023) 90 tablet 3   cholecalciferol (VITAMIN D) 400 units TABS tablet Take 400 Units by mouth daily.     diclofenac Sodium (VOLTAREN) 1 % GEL Apply 4 grams topically as directed  4  times daily. (Patient not taking: Reported on 05/22/2023) 100 g 2   ferrous sulfate 325 (65 FE) MG EC tablet Take 325 mg by mouth 1 day or 1 dose. (Patient not taking: Reported on 05/04/2023)     lidocaine (LIDODERM) 5 % PLACE 1 PATCH ONTO THE SKIN DAILY. REMOVE & DISCARD PATCH WITHIN 12 HOURS OR AS DIRECTED BY MD (Patient not taking: Reported on 05/22/2023) 30 patch 1   Omega-3 Fatty Acids (FISH OIL) 1000 MG CAPS Take 1,000 mg by mouth. (Patient not taking: Reported on 05/22/2023)     tadalafil (CIALIS) 5 MG tablet Take  1 tablet by mouth daily (Patient not taking: Reported on 05/22/2023) 30 tablet 11   vitamin E 1000 UNIT capsule Take 1,000 Units by mouth daily. (Patient not taking: Reported on 05/22/2023)     Current Facility-Administered Medications  Medication Dose Route Frequency Provider Last Rate Last Admin   0.9 %  sodium chloride infusion  500 mL Intravenous Continuous Lynann Bologna, MD        Allergies as of 05/22/2023   (No Known Allergies)    Family History  Problem Relation Age of Onset   Hypertension Mother    Breast cancer Sister    Cancer Sister    Cancer Brother    Lung cancer Brother    Pancreatic cancer Brother    Cancer Brother    Colon polyps Brother    Colon cancer Brother     Esophageal cancer Neg Hx    Rectal cancer Neg Hx    Stomach cancer Neg Hx     Social History   Socioeconomic History   Marital status: Married    Spouse name: Not on file   Number of children: Not on file   Years of education: Not on file   Highest education level: GED or equivalent  Occupational History   Occupation: Location manager  Tobacco Use   Smoking status: Never   Smokeless tobacco: Never  Substance and Sexual Activity   Alcohol use: Yes    Alcohol/week: 5.0 standard drinks of alcohol    Types: 5 Cans of beer per week   Drug use: No   Sexual activity: Yes  Other Topics Concern   Not on file  Social History Narrative   Not on file   Social Drivers of Health   Financial Resource Strain: Patient Declined (12/04/2022)   Overall Financial Resource Strain (CARDIA)    Difficulty of Paying Living Expenses: Patient declined  Food Insecurity: Patient Declined (12/04/2022)   Hunger Vital Sign    Worried About Running Out of Food in the Last Year: Patient declined    Ran Out of Food in the Last Year: Patient declined  Transportation Needs: Unknown (12/04/2022)   PRAPARE - Administrator, Civil Service (Medical): Patient declined    Lack of Transportation (Non-Medical): No  Physical Activity: Unknown (12/04/2022)   Exercise Vital Sign    Days of Exercise per Week: Patient declined    Minutes of Exercise per Session: Not on file  Stress: No Stress Concern Present (12/04/2022)   [employer] of Occupational Health - Occupational Stress Questionnaire    Feeling of Stress : Not at all  Social Connections: Moderately Integrated (12/04/2022)   Social Connection and Isolation Panel [NHANES]    Frequency of Communication with Friends and Family: More than three times a week    Frequency of Social Gatherings with Friends and Family: Three times a week    Attends Religious Services: More than 4 times per year    Active Member of Clubs or Organizations: No    Attends  Engineer, structural: Not on file    Marital Status: Married  Catering manager Violence: Not on file    Review of Systems: Positive for none All other review of systems negative except as mentioned in the HPI.  Physical Exam: Vital signs in last 24 hours: @VSRANGES @   General:   Alert,  Well-developed, well-nourished, pleasant and cooperative in NAD Lungs:  Clear throughout to auscultation.   Heart:  Regular rate and rhythm; no murmurs, clicks, rubs,  or gallops. Abdomen:  Soft, nontender and nondistended. Normal bowel sounds.   Neuro/Psych:  Alert and cooperative. Normal mood and affect. A and O x 3    No significant changes were identified.  The patient continues to be an appropriate candidate for the planned procedure and anesthesia.   Edman Circle, MD. [facility] 05/22/2023 3:19 PM@

## 2023-05-23 ENCOUNTER — Telehealth: Payer: Self-pay | Admitting: *Deleted

## 2023-05-23 NOTE — Telephone Encounter (Signed)
  Follow up Call-     05/22/2023    3:08 PM  Call back number  Post procedure Call Back phone  # [phone]  Permission to leave phone message Yes     Patient questions:  Do you have a fever, pain , or abdominal swelling? No. Pain Score  0 *  Have you tolerated food without any problems? Yes.    Have you been able to return to your normal activities? Yes.    Do you have any questions about your discharge instructions: Diet   No. Medications  No. Follow up visit  No.  Do you have questions or concerns about your Care? No.  Actions: * If pain score is 4 or above: No action needed, pain <4.

## 2023-05-25 ENCOUNTER — Other Ambulatory Visit (HOSPITAL_COMMUNITY): Payer: Self-pay

## 2023-09-19 ENCOUNTER — Other Ambulatory Visit: Payer: Self-pay | Admitting: Family Medicine

## 2023-09-19 ENCOUNTER — Other Ambulatory Visit (HOSPITAL_COMMUNITY): Payer: Self-pay

## 2023-09-19 DIAGNOSIS — I10 Essential (primary) hypertension: Secondary | ICD-10-CM

## 2023-09-19 MED ORDER — LISINOPRIL-HYDROCHLOROTHIAZIDE 20-12.5 MG PO TABS
1.0000 | ORAL_TABLET | Freq: Every day | ORAL | 1 refills | Status: DC
  Filled 2023-09-19: qty 90, 90d supply, fill #0
  Filled 2023-12-11: qty 90, 90d supply, fill #1

## 2023-10-18 DIAGNOSIS — N401 Enlarged prostate with lower urinary tract symptoms: Secondary | ICD-10-CM | POA: Diagnosis not present

## 2023-10-18 DIAGNOSIS — N5201 Erectile dysfunction due to arterial insufficiency: Secondary | ICD-10-CM | POA: Diagnosis not present

## 2023-10-18 DIAGNOSIS — R350 Frequency of micturition: Secondary | ICD-10-CM | POA: Diagnosis not present

## 2024-01-14 ENCOUNTER — Ambulatory Visit

## 2024-01-14 VITALS — BP 124/64 | HR 65 | Temp 97.8°F | Ht 67.0 in | Wt 182.8 lb

## 2024-01-14 DIAGNOSIS — Z0000 Encounter for general adult medical examination without abnormal findings: Secondary | ICD-10-CM

## 2024-01-14 NOTE — Patient Instructions (Addendum)
 Aaron Bowman,  Thank you for taking the time for your Medicare Wellness Visit. I appreciate your continued commitment to your health goals. Please review the care plan we discussed, and feel free to reach out if I can assist you further.  Medicare recommends these wellness visits once per year to help you and your care team stay ahead of potential health issues. These visits are designed to focus on prevention, allowing your provider to concentrate on managing your acute and chronic conditions during your regular appointments.  Please note that Annual Wellness Visits do not include a physical exam. Some assessments may be limited, especially if the visit was conducted virtually. If needed, we may recommend a separate in-person follow-up with your provider.  Ongoing Care Seeing your primary care provider every 3 to 6 months helps us  monitor your health and provide consistent, personalized care.   Referrals If a referral was made during today's visit and you haven't received any updates within two weeks, please contact the referred provider directly to check on the status.  Recommended Screenings:  Health Maintenance  Topic Date Due   Flu Shot  11/09/2023   COVID-19 Vaccine (2 - 2025-26 season) 12/10/2023   Medicare Annual Wellness Visit  01/13/2025   Colon Cancer Screening  05/21/2028   DTaP/Tdap/Td vaccine (5 - Td or Tdap) 06/26/2028   Pneumococcal Vaccine for age over 57  Completed   Hepatitis C Screening  Completed   Zoster (Shingles) Vaccine  Completed   Meningitis B Vaccine  Aged Out       01/14/2024    9:00 AM  Advanced Directives  Does Patient Have a Medical Advance Directive? No  Would patient like information on creating a medical advance directive? No - Patient declined   Advance Care Planning is important because it: Ensures you receive medical care that aligns with your values, goals, and preferences. Provides guidance to your family and loved ones, reducing the  emotional burden of decision-making during critical moments.  Vision: Annual vision screenings are recommended for early detection of glaucoma, cataracts, and diabetic retinopathy. These exams can also reveal signs of chronic conditions such as diabetes and high blood pressure.  Dental: Annual dental screenings help detect early signs of oral cancer, gum disease, and other conditions linked to overall health, including heart disease and diabetes.  Please see the attached documents for additional preventive care recommendations.

## 2024-01-14 NOTE — Progress Notes (Signed)
 Subjective:   Aaron Bowman is a 68 y.o. who presents for a Medicare Wellness preventive visit.  As a reminder, Annual Wellness Visits don't include a physical exam, and some assessments may be limited, especially if this visit is performed virtually. We may recommend an in-person follow-up visit with your provider if needed.  Visit Complete: In person    Persons Participating in Visit: Patient assisted by Interpreter.  AWV Questionnaire: Yes: Patient Medicare AWV questionnaire was completed by the patient on 01/10/24; I have confirmed that all information answered by patient is correct and no changes since this date.  Cardiac Risk Factors include: advanced age (>83men, >20 women);male gender;hypertension     Objective:    Today's Vitals   01/14/24 0837  BP: 124/64  Pulse: 65  Temp: 97.8 F (36.6 C)  TempSrc: Oral  SpO2: 97%  Weight: 182 lb 12.8 oz (82.9 kg)  Height: 5' 7 (1.702 m)   Body mass index is 28.63 kg/m.     01/14/2024    9:00 AM 06/13/2017    4:03 PM  Advanced Directives  Does Patient Have a Medical Advance Directive? No No   Would patient like information on creating a medical advance directive? No - Patient declined No - Patient declined      Data saved with a previous flowsheet row definition    Current Medications (verified) Outpatient Encounter Medications as of 01/14/2024  Medication Sig   amLODipine  (NORVASC ) 5 MG tablet Take 1 tablet (5 mg total) by mouth daily.   atorvastatin  (LIPITOR) 10 MG tablet Take 1 tablet (10 mg total) by mouth daily. (Patient not taking: Reported on 05/22/2023)   cholecalciferol (VITAMIN D ) 400 units TABS tablet Take 400 Units by mouth daily.   diclofenac  Sodium (VOLTAREN ) 1 % GEL Apply 4 grams topically as directed  4  times daily. (Patient not taking: Reported on 05/22/2023)   ferrous sulfate 325 (65 FE) MG EC tablet Take 325 mg by mouth 1 day or 1 dose. (Patient not taking: Reported on 05/04/2023)   lidocaine  (LIDODERM )  5 % PLACE 1 PATCH ONTO THE SKIN DAILY. REMOVE & DISCARD PATCH WITHIN 12 HOURS OR AS DIRECTED BY MD (Patient not taking: Reported on 05/22/2023)   lisinopril -hydrochlorothiazide  (ZESTORETIC ) 20-12.5 MG tablet Take 1 tablet by mouth daily. Due for physical in 11/2023   Omega-3 Fatty Acids (FISH OIL) 1000 MG CAPS Take 1,000 mg by mouth. (Patient not taking: Reported on 05/22/2023)   tadalafil  (CIALIS ) 5 MG tablet Take 1 tablet by mouth daily (Patient not taking: Reported on 05/22/2023)   vitamin B-12 (CYANOCOBALAMIN ) 100 MCG tablet Take 100 mcg by mouth daily.   vitamin E 1000 UNIT capsule Take 1,000 Units by mouth daily. (Patient not taking: Reported on 05/22/2023)   No facility-administered encounter medications on file as of 01/14/2024.    Allergies (verified) Patient has no known allergies.   History: Past Medical History:  Diagnosis Date   Arthritis    GERD (gastroesophageal reflux disease)    Hypertension    History reviewed. No pertinent surgical history. Family History  Problem Relation Age of Onset   Hypertension Mother    Breast cancer Sister    Cancer Sister    Cancer Brother    Lung cancer Brother    Pancreatic cancer Brother    Cancer Brother    Colon polyps Brother    Colon cancer Brother    Esophageal cancer Neg Hx    Rectal cancer Neg Hx    Stomach  cancer Neg Hx    Social History   Socioeconomic History   Marital status: Married    Spouse name: Not on file   Number of children: Not on file   Years of education: Not on file   Highest education level: GED or equivalent  Occupational History   Occupation: Location manager  Tobacco Use   Smoking status: Never   Smokeless tobacco: Never  Substance and Sexual Activity   Alcohol use: Yes    Alcohol/week: 5.0 standard drinks of alcohol    Types: 5 Cans of beer per week   Drug use: No   Sexual activity: Yes  Other Topics Concern   Not on file  Social History Narrative   Not on file   Social Drivers of Health    Financial Resource Strain: Patient Declined (01/14/2024)   Overall Financial Resource Strain (CARDIA)    Difficulty of Paying Living Expenses: Patient declined  Food Insecurity: Patient Declined (01/14/2024)   Hunger Vital Sign    Worried About Running Out of Food in the Last Year: Patient declined    Ran Out of Food in the Last Year: Patient declined  Transportation Needs: Patient Declined (01/14/2024)   PRAPARE - Administrator, Civil Service (Medical): Patient declined    Lack of Transportation (Non-Medical): Patient declined  Physical Activity: Inactive (01/14/2024)   Exercise Vital Sign    Days of Exercise per Week: 0 days    Minutes of Exercise per Session: 0 min  Stress: No Stress Concern Present (01/14/2024)   [employer] of Occupational Health - Occupational Stress Questionnaire    Feeling of Stress: Not at all  Social Connections: Unknown (01/14/2024)   Social Connection and Isolation Panel    Frequency of Communication with Friends and Family: Once a week    Frequency of Social Gatherings with Friends and Family: Patient declined    Attends Religious Services: Patient declined    Database administrator or Organizations: Patient declined    Attends Engineer, structural: Not on file    Marital Status: Married    Tobacco Counseling Counseling given: Not Answered    Clinical Intake:  Pre-visit preparation completed: Yes  Pain : No/denies pain     BMI - recorded: 28.63 Nutritional Status: BMI 25 -29 Overweight Nutritional Risks: None Diabetes: No  Lab Results  Component Value Date   HGBA1C 5.6 12/08/2022   HGBA1C 5.8 12/02/2021   HGBA1C 5.8 04/22/2021     How often do you need to have someone help you when you read instructions, pamphlets, or other written materials from your doctor or pharmacy?: 1 - Never  Interpreter Needed?: No  Information entered by :: Rojelio Blush LPN   Activities of Daily Living     01/14/2024     8:32 AM 01/10/2024    3:04 PM  In your present state of health, do you have any difficulty performing the following activities:  Hearing? 0 0  Vision? 0 0  Difficulty concentrating or making decisions? 0 0  Walking or climbing stairs? 0 0  Dressing or bathing? 0 0  Doing errands, shopping? 0 0  Preparing Food and eating ? N N  Using the Toilet? N N  In the past six months, have you accidently leaked urine? N N  Do you have problems with loss of bowel control? N N  Managing your Medications? N N  Managing your Finances? N N  Housekeeping or managing your Housekeeping? N N  Patient Care Team: [place], Betty G, MD as PCP - General (Family Medicine)  I have updated your Care Teams any recent Medical Services you may have received from other providers in the past year.     Assessment:   This is a routine wellness examination for [place].  Hearing/Vision screen Hearing Screening - Comments:: Denies hearing difficulties   Vision Screening - Comments:: Wears rx glasses - up to date with routine eye exams with  Dr Kennyth   Goals Addressed               This Visit's Progress     Increase physical activity (pt-stated)        Remain active.       Depression Screen     01/14/2024    8:30 AM 12/08/2022    7:33 AM 12/02/2021    7:59 AM 04/22/2021    3:22 PM 10/19/2020    1:17 PM 09/18/2019    8:10 PM 09/10/2018    3:11 PM  PHQ 2/9 Scores  PHQ - 2 Score 0 0 0 0 0 0 0  PHQ- 9 Score  0         Fall Risk     01/14/2024    8:33 AM 01/10/2024    3:04 PM 12/08/2022    7:33 AM 12/02/2021    7:59 AM 09/18/2019    8:11 PM  Fall Risk   Falls in the past year? 0 0 0 0 0  Number falls in past yr: 0  0 0 0  Injury with Fall? 0 0 0 0 0  Risk for fall due to : No Fall Risks  Other (Comment) No Fall Risks   Follow up Falls evaluation completed  Falls evaluation completed Falls evaluation completed       Data saved with a previous flowsheet row definition    MEDICARE RISK AT HOME:   Medicare Risk at Home Any stairs in or around the home?: Yes If so, are there any without handrails?: No Home free of loose throw rugs in walkways, pet beds, electrical cords, etc?: Yes Adequate lighting in your home to reduce risk of falls?: Yes Life alert?: No Use of a cane, walker or w/c?: No Grab bars in the bathroom?: No Shower chair or bench in shower?: No Elevated toilet seat or a handicapped toilet?: No  TIMED UP AND GO:  Was the test performed?  Yes  Length of time to ambulate 10 feet: 10 sec Gait steady and fast without use of assistive device  Cognitive Function: 6CIT completed        01/14/2024    8:33 AM  6CIT Screen  What Year? 0 points  What month? 0 points  What time? 0 points  Count back from 20 0 points  Months in reverse 0 points  Repeat phrase 0 points  Total Score 0 points    Immunizations Immunization History  Administered Date(s) Administered   Fluad Trivalent(High Dose 65+) 02/26/2023   Influenza,inj,Quad PF,6+ Mos 04/20/2016, 05/14/2017, 01/23/2018, 12/10/2018, 03/17/2020, 04/19/2022   Influenza-Unspecified 01/03/2013   Janssen (J&J) SARS-COV-2 Vaccination 06/14/2019   PNEUMOCOCCAL CONJUGATE-20 12/08/2022   Td 06/18/2007   Tdap 07/13/2009, 01/03/2013, 06/27/2018   Zoster Recombinant(Shingrix) 09/16/2019, 11/17/2019    Screening Tests Health Maintenance  Topic Date Due   Influenza Vaccine  11/09/2023   COVID-19 Vaccine (2 - 2025-26 season) 12/10/2023   Medicare Annual Wellness (AWV)  01/13/2025   Colonoscopy  05/21/2028   DTaP/Tdap/Td (5 - Td or  Tdap) 06/26/2028   Pneumococcal Vaccine: 50+ Years  Completed   Hepatitis C Screening  Completed   Zoster Vaccines- Shingrix  Completed   Meningococcal B Vaccine  Aged Out    Health Maintenance Items Addressed:   Additional Screening:  Vision Screening: Recommended annual ophthalmology exams for early detection of glaucoma and other disorders of the eye. Is the patient up to date with  their annual eye exam?  Yes  Who is the provider or what is the name of the office in which the patient attends annual eye exams? Dr Kennyth  Dental Screening: Recommended annual dental exams for proper oral hygiene  Community Resource Referral / Chronic Care Management: CRR required this visit?  No   CCM required this visit?  No   Plan:    I have personally reviewed and noted the following in the patient's chart:   Medical and social history Use of alcohol, tobacco or illicit drugs  Current medications and supplements including opioid prescriptions. Patient is not currently taking opioid prescriptions. Functional ability and status Nutritional status Physical activity Advanced directives List of other physicians Hospitalizations, surgeries, and ER visits in previous 12 months Vitals Screenings to include cognitive, depression, and falls Referrals and appointments  In addition, I have reviewed and discussed with patient certain preventive protocols, quality metrics, and best practice recommendations. A written personalized care plan for preventive services as well as general preventive health recommendations were provided to patient.   Rojelio LELON Blush, LPN   [id number]/06/7972   After Visit Summary: (In Person-Printed) AVS printed and given to the patient  Notes: Nothing significant to report at this time.

## 2024-01-15 ENCOUNTER — Ambulatory Visit: Payer: Self-pay | Admitting: Family Medicine

## 2024-01-15 ENCOUNTER — Other Ambulatory Visit (HOSPITAL_COMMUNITY): Payer: Self-pay

## 2024-01-15 ENCOUNTER — Ambulatory Visit: Admitting: Family Medicine

## 2024-01-15 ENCOUNTER — Encounter: Payer: Self-pay | Admitting: Family Medicine

## 2024-01-15 VITALS — BP 160/85 | HR 66 | Temp 97.9°F | Resp 16 | Ht 67.0 in | Wt 187.4 lb

## 2024-01-15 DIAGNOSIS — Z0000 Encounter for general adult medical examination without abnormal findings: Secondary | ICD-10-CM

## 2024-01-15 DIAGNOSIS — E785 Hyperlipidemia, unspecified: Secondary | ICD-10-CM | POA: Diagnosis not present

## 2024-01-15 DIAGNOSIS — R7303 Prediabetes: Secondary | ICD-10-CM

## 2024-01-15 DIAGNOSIS — I10 Essential (primary) hypertension: Secondary | ICD-10-CM | POA: Diagnosis not present

## 2024-01-15 DIAGNOSIS — E538 Deficiency of other specified B group vitamins: Secondary | ICD-10-CM

## 2024-01-15 DIAGNOSIS — R351 Nocturia: Secondary | ICD-10-CM

## 2024-01-15 DIAGNOSIS — L989 Disorder of the skin and subcutaneous tissue, unspecified: Secondary | ICD-10-CM

## 2024-01-15 DIAGNOSIS — E559 Vitamin D deficiency, unspecified: Secondary | ICD-10-CM | POA: Diagnosis not present

## 2024-01-15 DIAGNOSIS — G8929 Other chronic pain: Secondary | ICD-10-CM

## 2024-01-15 DIAGNOSIS — N401 Enlarged prostate with lower urinary tract symptoms: Secondary | ICD-10-CM | POA: Diagnosis not present

## 2024-01-15 DIAGNOSIS — Z23 Encounter for immunization: Secondary | ICD-10-CM | POA: Diagnosis not present

## 2024-01-15 DIAGNOSIS — M5441 Lumbago with sciatica, right side: Secondary | ICD-10-CM

## 2024-01-15 LAB — VITAMIN B12: Vitamin B-12: 1014 pg/mL — ABNORMAL HIGH (ref 211–911)

## 2024-01-15 LAB — COMPREHENSIVE METABOLIC PANEL WITH GFR
ALT: 27 U/L (ref 0–53)
AST: 32 U/L (ref 0–37)
Albumin: 4.5 g/dL (ref 3.5–5.2)
Alkaline Phosphatase: 57 U/L (ref 39–117)
BUN: 15 mg/dL (ref 6–23)
CO2: 29 meq/L (ref 19–32)
Calcium: 9.3 mg/dL (ref 8.4–10.5)
Chloride: 103 meq/L (ref 96–112)
Creatinine, Ser: 0.92 mg/dL (ref 0.40–1.50)
GFR: 86.63 mL/min (ref >60.00)
Glucose, Bld: 85 mg/dL (ref 70–99)
Potassium: 4 meq/L (ref 3.5–5.1)
Sodium: 140 meq/L (ref 135–145)
Total Bilirubin: 0.6 mg/dL (ref 0.2–1.2)
Total Protein: 7.7 g/dL (ref 6.0–8.3)

## 2024-01-15 LAB — VITAMIN D 25 HYDROXY (VIT D DEFICIENCY, FRACTURES): VITD: 20.58 ng/mL — ABNORMAL LOW (ref 30.00–100.00)

## 2024-01-15 LAB — LIPID PANEL
Cholesterol: 224 mg/dL — ABNORMAL HIGH (ref 0–200)
HDL: 40.4 mg/dL (ref >39.00)
LDL Cholesterol: 165 mg/dL — ABNORMAL HIGH (ref 0–99)
NonHDL: 183.47
Total CHOL/HDL Ratio: 6
Triglycerides: 94 mg/dL (ref 0.0–149.0)
VLDL: 18.8 mg/dL (ref 0.0–40.0)

## 2024-01-15 LAB — HEMOGLOBIN A1C: Hgb A1c MFr Bld: 5.6 % (ref 4.6–6.5)

## 2024-01-15 MED ORDER — NYSTATIN 100000 UNIT/GM EX CREA
1.0000 | TOPICAL_CREAM | Freq: Two times a day (BID) | CUTANEOUS | 0 refills | Status: AC
  Filled 2024-01-15: qty 30, 14d supply, fill #0

## 2024-01-15 MED ORDER — LIDOCAINE 5 % EX PTCH
1.0000 | MEDICATED_PATCH | Freq: Every day | CUTANEOUS | 1 refills | Status: AC
  Filled 2024-01-15: qty 30, 30d supply, fill #0

## 2024-01-15 NOTE — Assessment & Plan Note (Signed)
 BP elevated today. Reporting BPs at home 130s/80s and yesterday during Medicare visit 124/64. He is not interested in adding benazepril at this time, so for now continue amlodipine  5 mg daily. Monitoring BP regularly at home, instructed to let me know about BP readings in 3 weeks. Eye exam is current. As far as problem is stable, continue annual follow-ups.

## 2024-01-15 NOTE — Patient Instructions (Addendum)
 A few things to remember from today's visit:  Routine general medical examination at a health care facility  Vitamin D  deficiency, unspecified - Plan: VITAMIN D  25 Hydroxy (Vit-D Deficiency, Fractures)  Hyperlipidemia, unspecified hyperlipidemia type - Plan: Comprehensive metabolic panel with GFR, Lipid panel  BPH associated with nocturia  B12 deficiency - Plan: Vitamin B12  Essential hypertension - Plan: Comprehensive metabolic panel with GFR  Chronic right-sided low back pain with right-sided sciatica - Plan: lidocaine  (LIDODERM ) 5 %  Prediabetes - Plan: Hemoglobin A1c  Lesion de la piel parece una keratosis seboreica, benigna y aparece con la edad. Mandeme presiones arteriales en 3 semanas.

## 2024-01-15 NOTE — Assessment & Plan Note (Signed)
Follows with urologist annually.

## 2024-01-15 NOTE — Assessment & Plan Note (Signed)
 We discussed the importance of regular physical activity and healthy diet for prevention of chronic illness and/or complications. Preventive guidelines reviewed. Vaccination up today, flu vaccine given today. Next CPE in a year.

## 2024-01-15 NOTE — Progress Notes (Signed)
 HPI: Aaron Bowman is a 68 y.o.male with PMHx significant for prediabetes,HTN,HLD,B12 def, BPH,and aortic atherosclerosis here today for his routine physical examination.  Last CPE: 12/08/22. No new problems since his last visit.  He exercises regularly, going to the gym 5 times per week. He eats homemade meals at home most of the time, vegetables almost every day. He drinks occasionally, usually when he meets with friends, 1-2 times per month, usually whiskey. No history of tobacco use. He sleeps for approximately 8 hours per night. He follows with eye care provider and dentist regularly.  Immunization History  Administered Date(s) Administered   Fluad Trivalent(High Dose 65+) 02/26/2023   Influenza,inj,Quad PF,6+ Mos 04/20/2016, 05/14/2017, 01/23/2018, 12/10/2018, 03/17/2020, 04/19/2022   Influenza-Unspecified 01/03/2013   Janssen (J&J) SARS-COV-2 Vaccination 06/14/2019   PNEUMOCOCCAL CONJUGATE-20 12/08/2022   Td 06/18/2007   Tdap 07/13/2009, 01/03/2013, 06/27/2018   Zoster Recombinant(Shingrix) 09/16/2019, 11/17/2019   Health Maintenance  Topic Date Due   Influenza Vaccine  11/09/2023   COVID-19 Vaccine (2 - 2025-26 season) 01/31/2024 (Originally 12/10/2023)   Medicare Annual Wellness (AWV)  01/13/2025   Colonoscopy  05/21/2028   DTaP/Tdap/Td (5 - Td or Tdap) 06/26/2028   Pneumococcal Vaccine: 50+ Years  Completed   Hepatitis C Screening  Completed   Zoster Vaccines- Shingrix  Completed   Meningococcal B Vaccine  Aged Out   BPH with nocturia: He sees his urologist annually. Lower back pain: He uses lidocaine  patches, which help.  Hyperlipidemia and aortic atherosclerosis:He is on nonpharmacologic treatment.  In the past he was on atorvastatin , discontinued about 2 years ago.  Lab Results  Component Value Date   CHOL 183 12/08/2022   HDL 35.60 (L) 12/08/2022   LDLCALC 128 (H) 12/08/2022   LDLDIRECT 157.6 06/25/2007   TRIG 101.0 12/08/2022   CHOLHDL 5 12/08/2022    HTN:He is currently taking amlodipine  5 mg daily.  He discontinued lisinopril -hydrochlorothiazide  20-12.5 mg about a year ago. Home BP readings 130s/80s.  Lab Results  Component Value Date   NA 140 12/08/2022   CL 103 12/08/2022   K 4.1 12/08/2022   CO2 29 12/08/2022   BUN 15 12/08/2022   CREATININE 1.06 12/08/2022   GFRNONAA >60 11/28/2021   CALCIUM  9.3 12/08/2022   ALBUMIN 4.2 12/08/2022   GLUCOSE 85 12/08/2022   B12 and vitamin D  deficiency: He has not taking B12 supplementation for the past 3 to 4 weeks and has not been consistent with taking vitamin D  supplementation.  Lab Results  Component Value Date   VITAMINB12 189 (L) 12/08/2022   Lab Results  Component Value Date   HGBA1C 5.6 12/08/2022   Concerned about right inguinal lesion noted a week ago, thinks it may be fungal. Noted after coming back from [place]. No sick contact. Lesion is not tender or pruritic. Has not changed in size.  Review of Systems  Constitutional:  Negative for activity change, appetite change, chills and fever.  HENT:  Negative for nosebleeds, sore throat and trouble swallowing.   Eyes:  Negative for redness and visual disturbance.  Respiratory:  Negative for cough, shortness of breath and wheezing.   Cardiovascular:  Negative for chest pain, palpitations and leg swelling.  Gastrointestinal:  Negative for abdominal pain, blood in stool, nausea and vomiting.  Endocrine: Negative for cold intolerance, heat intolerance, polydipsia, polyphagia and polyuria.  Genitourinary:  Negative for decreased urine volume, dysuria, genital sores, hematuria and testicular pain.  Musculoskeletal:  Positive for arthralgias and back pain. Negative for gait problem  and myalgias.  Skin:  Negative for color change and rash.  Allergic/Immunologic: Negative for environmental allergies.  Neurological:  Negative for syncope, weakness and headaches.  Hematological:  Negative for adenopathy. Does not bruise/bleed easily.   Psychiatric/Behavioral:  Negative for confusion and sleep disturbance.    Current Outpatient Medications on File Prior to Visit  Medication Sig Dispense Refill   cholecalciferol (VITAMIN D ) 400 units TABS tablet Take 400 Units by mouth daily.     vitamin B-12 (CYANOCOBALAMIN ) 100 MCG tablet Take 100 mcg by mouth daily.     amLODipine  (NORVASC ) 5 MG tablet Take 1 tablet (5 mg total) by mouth daily. 90 tablet 3   No current facility-administered medications on file prior to visit.   Past Medical History:  Diagnosis Date   Arthritis    GERD (gastroesophageal reflux disease)    Hypertension    History reviewed. No pertinent surgical history.  No Known Allergies  Family History  Problem Relation Age of Onset   Hypertension Mother    Breast cancer Sister    Cancer Sister    Cancer Brother    Lung cancer Brother    Pancreatic cancer Brother    Cancer Brother    Colon polyps Brother    Colon cancer Brother    Esophageal cancer Neg Hx    Rectal cancer Neg Hx    Stomach cancer Neg Hx    Social History   Socioeconomic History   Marital status: Married    Spouse name: Not on file   Number of children: Not on file   Years of education: Not on file   Highest education level: GED or equivalent  Occupational History   Occupation: Location manager  Tobacco Use   Smoking status: Never   Smokeless tobacco: Never  Substance and Sexual Activity   Alcohol use: Yes    Alcohol/week: 5.0 standard drinks of alcohol    Types: 5 Cans of beer per week   Drug use: No   Sexual activity: Yes  Other Topics Concern   Not on file  Social History Narrative   Not on file   Social Drivers of Health   Financial Resource Strain: Patient Declined (01/14/2024)   Overall Financial Resource Strain (CARDIA)    Difficulty of Paying Living Expenses: Patient declined  Food Insecurity: Patient Declined (01/14/2024)   Hunger Vital Sign    Worried About Running Out of Food in the Last Year: Patient  declined    Ran Out of Food in the Last Year: Patient declined  Transportation Needs: Patient Declined (01/14/2024)   PRAPARE - Administrator, Civil Service (Medical): Patient declined    Lack of Transportation (Non-Medical): Patient declined  Physical Activity: Inactive (01/14/2024)   Exercise Vital Sign    Days of Exercise per Week: 0 days    Minutes of Exercise per Session: 0 min  Stress: No Stress Concern Present (01/14/2024)   [employer] of Occupational Health - Occupational Stress Questionnaire    Feeling of Stress: Not at all  Social Connections: Unknown (01/14/2024)   Social Connection and Isolation Panel    Frequency of Communication with Friends and Family: Once a week    Frequency of Social Gatherings with Friends and Family: Patient declined    Attends Religious Services: Patient declined    Database administrator or Organizations: Patient declined    Attends Banker Meetings: Not on file    Marital Status: Married    Vitals:  01/15/24 0816 01/15/24 0910  BP: (!) 168/80 (!) 160/85  Pulse: 66   Resp: 16   Temp: 97.9 F (36.6 C)   SpO2: 99%    Body mass index is 29.35 kg/m.  Wt Readings from Last 3 Encounters:  01/15/24 187 lb 6.4 oz (85 kg)  01/14/24 182 lb 12.8 oz (82.9 kg)  05/22/23 181 lb (82.1 kg)   Physical Exam Vitals and nursing note reviewed.  Constitutional:      General: He is not in acute distress.    Appearance: He is well-developed.  HENT:     Head: Normocephalic and atraumatic.     Right Ear: Tympanic membrane, ear canal and external ear normal.     Left Ear: External ear normal.     Ears:     Comments: Excess cerumen in left ear canal, could not see TM.    Mouth/Throat:     Mouth: Mucous membranes are moist.     Pharynx: Oropharynx is clear.  Eyes:     Extraocular Movements: Extraocular movements intact.     Conjunctiva/sclera: Conjunctivae normal.     Pupils: Pupils are equal, round, and reactive to  light.  Neck:     Thyroid : No thyroid  mass or thyromegaly.  Cardiovascular:     Rate and Rhythm: Normal rate and regular rhythm.     Pulses:          Dorsalis pedis pulses are 2+ on the right side and 2+ on the left side.     Heart sounds: No murmur heard. Pulmonary:     Effort: Pulmonary effort is normal. No respiratory distress.     Breath sounds: Normal breath sounds.  Abdominal:     Palpations: Abdomen is soft. There is no hepatomegaly or mass.     Tenderness: There is no abdominal tenderness.  Genitourinary:    Comments: No concerns. Musculoskeletal:        General: No tenderness.     Cervical back: Normal range of motion.     Comments: No signs of synovitis.  Lymphadenopathy:     Cervical: No cervical adenopathy.     Upper Body:     Right upper body: No supraclavicular adenopathy.     Left upper body: No supraclavicular adenopathy.  Skin:    General: Skin is warm.     Findings: No erythema.      Neurological:     General: No focal deficit present.     Mental Status: He is alert and oriented to person, place, and time.     Cranial Nerves: No cranial nerve deficit.     Sensory: No sensory deficit.     Gait: Gait normal.     Deep Tendon Reflexes:     Reflex Scores:      Bicep reflexes are 2+ on the right side and 2+ on the left side.      Patellar reflexes are 2+ on the right side and 2+ on the left side. Psychiatric:        Mood and Affect: Mood and affect normal.   ASSESSMENT AND PLAN:  [name] was seen today for annual exam.  Diagnoses and all orders for this visit: Orders Placed This Encounter  Procedures   Comprehensive metabolic panel with GFR   Hemoglobin A1c   Lipid panel   VITAMIN D  25 Hydroxy (Vit-D Deficiency, Fractures)   Vitamin B12   Lab Results  Component Value Date   VITAMINB12 1,014 (H) 01/15/2024   Lab Results  Component Value Date   VD25OH 20.58 (L) 01/15/2024   Lab Results  Component Value Date   CHOL 224 (H) 01/15/2024   HDL  40.40 01/15/2024   LDLCALC 165 (H) 01/15/2024   LDLDIRECT 157.6 06/25/2007   TRIG 94.0 01/15/2024   CHOLHDL 6 01/15/2024   Lab Results  Component Value Date   ALT 27 01/15/2024   AST 32 01/15/2024   ALKPHOS 57 01/15/2024   BILITOT 0.6 01/15/2024   Lab Results  Component Value Date   NA 140 01/15/2024   CL 103 01/15/2024   K 4.0 01/15/2024   CO2 29 01/15/2024   BUN 15 01/15/2024   CREATININE 0.92 01/15/2024   GFR 86.63 01/15/2024   CALCIUM  9.3 01/15/2024   ALBUMIN 4.5 01/15/2024   GLUCOSE 85 01/15/2024   Lab Results  Component Value Date   HGBA1C 5.6 01/15/2024   Routine general medical examination at a health care facility Assessment & Plan: We discussed the importance of regular physical activity and healthy diet for prevention of chronic illness and/or complications. Preventive guidelines reviewed. Vaccination up today, flu vaccine given today. Next CPE in a year.  Vitamin D  deficiency, unspecified Assessment & Plan: He has not been consistent with taking a vitamin D  supplementation. Further recommendation will be given according to 25 OH vitamin D  result.  Orders: -     VITAMIN D  25 Hydroxy (Vit-D Deficiency, Fractures); Future  Hyperlipidemia, unspecified hyperlipidemia type Assessment & Plan: Currently on non pharmacologic treatment. Further recommendations according to FLP result.  Orders: -     Comprehensive metabolic panel with GFR; Future -     Lipid panel; Future  BPH associated with nocturia Assessment & Plan: Follows with urologist annually.  B12 deficiency Assessment & Plan: He has not been consistent taking vitamin B12 supplementation for the past 3 to 4 weeks, planning on resuming it. B12 added to today's labs.  Orders: -     Vitamin B12; Future  Essential hypertension Assessment & Plan: BP elevated today. Reporting BPs at home 130s/80s and yesterday during Medicare visit 124/64. He is not interested in adding benazepril at this  time, so for now continue amlodipine  5 mg daily. Monitoring BP regularly at home, instructed to let me know about BP readings in 3 weeks. Eye exam is current. As far as problem is stable, continue annual follow-ups.  Orders: -     Comprehensive metabolic panel with GFR; Future  Chronic right-sided low back pain with right-sided sciatica Assessment & Plan: Stable. Lidocaine  patches use daily as needed help.  Orders: -     Lidocaine ; PLACE 1 PATCH ONTO THE SKIN DAILY. REMOVE & DISCARD PATCH WITHIN 12 HOURS OR AS DIRECTED BY MD  Dispense: 30 patch; Refill: 1  Prediabetes Assessment & Plan: Encouraged consistency with a healthy lifestyle for diabetes prevention. Further recommendation will be given according to hemoglobin A1c result.  Orders: -     Hemoglobin A1c; Future  Skin lesion We discussed possible etiologies, patient does not have suspicious features, he is concerned about possible fungal infection, he can try nystatin twice daily for 2 weeks. I think it is a seborrheic keratosis, we discussed diagnosis and prognosis.  -     Nystatin; Apply topically 2 (two) times daily for 14 days.  Dispense: 30 g; Refill: 0  Need for immunization against influenza -     Flu vaccine HIGH DOSE PF(Fluzone Trivalent)   Return in 1 year (on 01/14/2025) for CPE, chronic problems.  Rainey Rodger G. [place], MD  [facility]. [place] office.

## 2024-01-15 NOTE — Assessment & Plan Note (Signed)
Encouraged consistency with a healthy lifestyle for diabetes prevention. Further recommendation will be given according to hemoglobin A1c result. 

## 2024-01-15 NOTE — Assessment & Plan Note (Signed)
 Stable. Lidocaine  patches use daily as needed help.

## 2024-01-15 NOTE — Assessment & Plan Note (Signed)
 Currently on non pharmacologic treatment. Further recommendations according to FLP result.

## 2024-01-15 NOTE — Assessment & Plan Note (Signed)
 He has not been consistent taking vitamin B12 supplementation for the past 3 to 4 weeks, planning on resuming it. B12 added to today's labs.

## 2024-01-15 NOTE — Assessment & Plan Note (Signed)
 He has not been consistent with taking a vitamin D  supplementation. Further recommendation will be given according to 25 OH vitamin D  result.

## 2024-01-16 ENCOUNTER — Telehealth: Payer: Self-pay

## 2024-01-16 NOTE — Telephone Encounter (Signed)
 Spoke with patient using language line interpreter, [phone]. Results given to patient and recommendations. Patient scheduled for 3 month follow up. Patient voiced understanding.

## 2024-01-16 NOTE — Telephone Encounter (Signed)
 Spoke with patient using language line interpreter, [phone]. Results given to patient and recommendations. Patient scheduled for 3 month follow up. Patient voiced understanding.    Copied from CRM #[id number]. Topic: Clinical - Lab/Test Results >> Jan 16, 2024 10:07 AM Dedra B wrote: Reason for CRM: Pt returning call regarding lab results. Pt said he read the message in MyChart.

## 2024-01-17 NOTE — Addendum Note (Signed)
 Addended by: SWAZILAND, Donnella Morford G on: 01/17/2024 01:02 PM   Modules accepted: Level of Service

## 2024-01-21 ENCOUNTER — Other Ambulatory Visit: Payer: Self-pay | Admitting: Family Medicine

## 2024-01-21 ENCOUNTER — Other Ambulatory Visit (HOSPITAL_COMMUNITY): Payer: Self-pay

## 2024-01-21 DIAGNOSIS — I700 Atherosclerosis of aorta: Secondary | ICD-10-CM

## 2024-01-21 DIAGNOSIS — E785 Hyperlipidemia, unspecified: Secondary | ICD-10-CM

## 2024-01-24 ENCOUNTER — Other Ambulatory Visit (HOSPITAL_COMMUNITY): Payer: Self-pay

## 2024-01-24 ENCOUNTER — Encounter (HOSPITAL_COMMUNITY): Payer: Self-pay

## 2024-01-25 ENCOUNTER — Other Ambulatory Visit (HOSPITAL_COMMUNITY): Payer: Self-pay

## 2024-02-11 ENCOUNTER — Encounter: Admitting: Family Medicine

## 2024-02-14 DIAGNOSIS — Z8249 Family history of ischemic heart disease and other diseases of the circulatory system: Secondary | ICD-10-CM | POA: Diagnosis not present

## 2024-02-14 DIAGNOSIS — I10 Essential (primary) hypertension: Secondary | ICD-10-CM | POA: Diagnosis not present

## 2024-02-14 DIAGNOSIS — Z833 Family history of diabetes mellitus: Secondary | ICD-10-CM | POA: Diagnosis not present

## 2024-02-14 DIAGNOSIS — Z809 Family history of malignant neoplasm, unspecified: Secondary | ICD-10-CM | POA: Diagnosis not present

## 2024-02-15 DIAGNOSIS — M79674 Pain in right toe(s): Secondary | ICD-10-CM | POA: Diagnosis not present

## 2024-02-25 ENCOUNTER — Other Ambulatory Visit (HOSPITAL_COMMUNITY): Payer: Self-pay

## 2024-02-25 ENCOUNTER — Other Ambulatory Visit: Payer: Self-pay

## 2024-02-25 DIAGNOSIS — S8001XA Contusion of right knee, initial encounter: Secondary | ICD-10-CM | POA: Diagnosis not present

## 2024-02-25 DIAGNOSIS — S92523A Displaced fracture of medial phalanx of unspecified lesser toe(s), initial encounter for closed fracture: Secondary | ICD-10-CM | POA: Diagnosis not present

## 2024-02-25 MED ORDER — DICLOFENAC SODIUM 75 MG PO TBEC
75.0000 mg | DELAYED_RELEASE_TABLET | Freq: Two times a day (BID) | ORAL | 2 refills | Status: AC
  Filled 2024-02-25 (×2): qty 60, 30d supply, fill #0

## 2024-03-10 DIAGNOSIS — S92534D Nondisplaced fracture of distal phalanx of right lesser toe(s), subsequent encounter for fracture with routine healing: Secondary | ICD-10-CM | POA: Diagnosis not present

## 2024-03-10 DIAGNOSIS — S8001XD Contusion of right knee, subsequent encounter: Secondary | ICD-10-CM | POA: Diagnosis not present

## 2024-03-28 ENCOUNTER — Other Ambulatory Visit: Payer: Self-pay | Admitting: Family Medicine

## 2024-03-28 ENCOUNTER — Other Ambulatory Visit (HOSPITAL_COMMUNITY): Payer: Self-pay

## 2024-03-28 ENCOUNTER — Other Ambulatory Visit: Payer: Self-pay

## 2024-03-28 DIAGNOSIS — I10 Essential (primary) hypertension: Secondary | ICD-10-CM

## 2024-03-28 MED ORDER — AMLODIPINE BESYLATE 5 MG PO TABS
5.0000 mg | ORAL_TABLET | Freq: Every day | ORAL | 3 refills | Status: DC
  Filled 2024-03-28: qty 90, 90d supply, fill #0

## 2024-03-31 ENCOUNTER — Other Ambulatory Visit: Payer: Self-pay

## 2024-04-01 ENCOUNTER — Other Ambulatory Visit: Payer: Self-pay | Admitting: Family Medicine

## 2024-04-01 ENCOUNTER — Other Ambulatory Visit (HOSPITAL_COMMUNITY): Payer: Self-pay

## 2024-04-01 DIAGNOSIS — I10 Essential (primary) hypertension: Secondary | ICD-10-CM

## 2024-04-01 MED ORDER — AMLODIPINE BESYLATE 5 MG PO TABS: 5.0000 mg | ORAL_TABLET | Freq: Every day | ORAL | 3 refills | Status: AC

## 2024-04-01 NOTE — Telephone Encounter (Signed)
 Copied from CRM [id number]. Topic: Clinical - Medication Refill >> Apr 01, 2024  1:03 PM Eva FALCON wrote: Medication: amLODipine  (NORVASC ) 5 MG tablet  Has the patient contacted their pharmacy? Yes, states he was told a new script needs to be sent in.  (Agent: If no, request that the patient contact the pharmacy for the refill. If patient does not wish to contact the pharmacy document the reason why and proceed with request.) (Agent: If yes, when and what did the pharmacy advise?)  This is the patient's preferred pharmacy:  [place] [facility] [address] [place] [address] Phone: [phone] Fax: [phone]  Is this the correct pharmacy for this prescription? Yes If no, delete pharmacy and type the correct one.   Has the prescription been filled recently? No  Is the patient out of the medication? Yes  Has the patient been seen for an appointment in the last year OR does the patient have an upcoming appointment? Yes  Can we respond through MyChart? Yes  Agent: Please be advised that Rx refills may take up to 3 business days. We ask that you follow-up with your pharmacy.

## 2024-04-21 ENCOUNTER — Ambulatory Visit: Admitting: Family Medicine

## 2024-04-21 ENCOUNTER — Other Ambulatory Visit (HOSPITAL_COMMUNITY): Payer: Self-pay

## 2024-04-21 ENCOUNTER — Other Ambulatory Visit: Payer: Self-pay

## 2024-04-21 ENCOUNTER — Encounter: Payer: Self-pay | Admitting: Family Medicine

## 2024-04-21 VITALS — BP 133/81 | HR 68 | Temp 98.5°F | Resp 16 | Ht 67.0 in | Wt 184.4 lb

## 2024-04-21 DIAGNOSIS — E785 Hyperlipidemia, unspecified: Secondary | ICD-10-CM

## 2024-04-21 DIAGNOSIS — I10 Essential (primary) hypertension: Secondary | ICD-10-CM | POA: Diagnosis not present

## 2024-04-21 MED ORDER — ROSUVASTATIN CALCIUM 10 MG PO TABS
10.0000 mg | ORAL_TABLET | Freq: Every day | ORAL | 3 refills | Status: AC
  Filled 2024-04-21: qty 90, 90d supply, fill #0

## 2024-04-21 MED ORDER — LISINOPRIL-HYDROCHLOROTHIAZIDE 20-12.5 MG PO TABS
1.0000 | ORAL_TABLET | Freq: Every day | ORAL | 3 refills | Status: AC
  Filled 2024-04-21: qty 90, 90d supply, fill #0

## 2024-04-21 NOTE — Assessment & Plan Note (Signed)
 He did not start atorvastatin  due to lack of insurance coverage. We discussed pharmacologic recommendations and LDL goals. Recommend rosuvastatin  10 mg daily. Continue low-fat diet. Fasting lipid panel and ALT in 2 to 3 months.

## 2024-04-21 NOTE — Assessment & Plan Note (Signed)
 BP has improved, reporting similar readings at home. Continue amlodipine  5 mg daily and lisinopril -HCTZ 20-12.5 mg daily. Recommend low-salt/DASH diet. Continue monitoring BP at home. Eye exam is current. Follow-up in 01/2025 with his CPE, before if needed.

## 2024-04-21 NOTE — Progress Notes (Signed)
 "  Chief Complaint  Patient presents with   Medical Management of Chronic Issues    3 month follow up   Discussed the use of AI scribe software for clinical note transcription with the patient, who gave verbal consent to proceed.  History of Present Illness Aaron Bowman is a 69 year old male with PMHx significant for hypertension, prediabetes, and hyperlipidemia who presents for medication management and blood pressure monitoring. He was last seen on 01/15/2024 for his CPE.  He has been monitoring his blood pressure at home, with readings generally stable around 133/81 mmHg, though occasionally reaching 140 mmHg. Blood pressure tends to be higher in the afternoon compared to the morning. Previously, his blood pressure was well-controlled on a low-salt diet, but it increased after resuming a regular diet and gaining weight. He is currently taking amlodipine  5 mg, lisinopril -hydrochlorothiazide  20-12.5 mg.  Lab Results  Component Value Date   NA 140 01/15/2024   CL 103 01/15/2024   K 4.0 01/15/2024   CO2 29 01/15/2024   BUN 15 01/15/2024   CREATININE 0.92 01/15/2024   GFR 86.63 01/15/2024   CALCIUM  9.3 01/15/2024   ALBUMIN 4.5 01/15/2024   GLUCOSE 85 01/15/2024   Negative for unusual or severe headache, visual changes, exertional chest pain, dyspnea,  focal weakness, or edema.  Regarding hyperlipidemia, he has not taken atorvastatin  as previously prescribed due to insurance issues and has not been on any cholesterol-lowering medication recently. He avoids much fried food or pork, except occasionally during holidays.  Lab Results  Component Value Date   CHOL 224 (H) 01/15/2024   HDL 40.40 01/15/2024   LDLCALC 165 (H) 01/15/2024   LDLDIRECT 157.6 06/25/2007   TRIG 94.0 01/15/2024   CHOLHDL 6 01/15/2024   His diet typically includes low-fat foods, and he avoids sweets and fried foods. He consumes a breakfast of eggs, avocado, and a slice of whole-grain bread.  Review of Systems   Constitutional:  Negative for activity change, appetite change and fever.  HENT:  Negative for sore throat.   Respiratory:  Negative for cough and wheezing.   Gastrointestinal:  Negative for abdominal pain, nausea and vomiting.  Genitourinary:  Negative for decreased urine volume, dysuria and hematuria.  Skin:  Negative for rash.  Neurological:  Negative for syncope and facial asymmetry.  See other pertinent positives and negatives in HPI.  Medications Ordered Prior to Encounter[1]  Past Medical History:  Diagnosis Date   Arthritis    GERD (gastroesophageal reflux disease)    Hypertension     Allergies[2]  Social History   Socioeconomic History   Marital status: Married    Spouse name: Not on file   Number of children: Not on file   Years of education: Not on file   Highest education level: GED or equivalent  Occupational History   Occupation: location manager  Tobacco Use   Smoking status: Never   Smokeless tobacco: Never  Substance and Sexual Activity   Alcohol use: Yes    Alcohol/week: 5.0 standard drinks of alcohol    Types: 5 Cans of beer per week   Drug use: No   Sexual activity: Yes  Other Topics Concern   Not on file  Social History Narrative   Not on file   Social Drivers of Health   Tobacco Use: Low Risk (04/21/2024)   Patient History    Smoking Tobacco Use: Never    Smokeless Tobacco Use: Never    Passive Exposure: Not on file  Financial  Resource Strain: Patient Declined (01/14/2024)   Overall Financial Resource Strain (CARDIA)    Difficulty of Paying Living Expenses: Patient declined  Food Insecurity: Patient Declined (01/14/2024)   Epic    Worried About Programme Researcher, Broadcasting/film/video in the Last Year: Patient declined    Barista in the Last Year: Patient declined  Transportation Needs: Patient Declined (01/14/2024)   Epic    Lack of Transportation (Medical): Patient declined    Lack of Transportation (Non-Medical): Patient declined  Physical  Activity: Inactive (01/14/2024)   Exercise Vital Sign    Days of Exercise per Week: 0 days    Minutes of Exercise per Session: 0 min  Stress: No Stress Concern Present (01/14/2024)   [employer] of Occupational [employer] - Occupational Stress Questionnaire    Feeling of Stress: Not at all  Social Connections: Unknown (04/20/2024)   Social Connection and Isolation Panel    Frequency of Communication with Friends and Family: Not on file    Frequency of Social Gatherings with Friends and Family: Not on file    Attends Religious Services: Not on file    Active Member of Clubs or Organizations: Not on file    Attends Banker Meetings: Not on file    Marital Status: Married  Depression (PHQ2-9): Low Risk (01/14/2024)   Depression (PHQ2-9)    PHQ-2 Score: 0  Alcohol Screen: Low Risk (04/20/2024)   Alcohol Screen    Last Alcohol Screening Score (AUDIT): 3  Housing: Patient Declined (01/14/2024)   Epic    Unable to Pay for Housing in the Last Year: Patient declined    Number of Times Moved in the Last Year: Not on file    Homeless in the Last Year: Patient declined  Utilities: Not At Risk (01/14/2024)   Epic    Threatened with loss of utilities: No  Health Literacy: Adequate Health Literacy (01/14/2024)   B1300 Health Literacy    Frequency of need for help with medical instructions: Never    Today's Vitals   04/21/24 0800  BP: 133/81  Pulse: 68  Resp: 16  Temp: 98.5 F (36.9 C)  SpO2: 98%  Weight: 184 lb 6.4 oz (83.6 kg)  Height: 5' 7 (1.702 m)   Body mass index is 28.88 kg/m.  Physical Exam Vitals and nursing note reviewed.  Constitutional:      General: He is not in acute distress.    Appearance: He is well-developed.  HENT:     Head: Normocephalic and atraumatic.     Mouth/Throat:     Mouth: Mucous membranes are moist.  Eyes:     Conjunctiva/sclera: Conjunctivae normal.  Cardiovascular:     Rate and Rhythm: Normal rate and regular rhythm.     Pulses:           Dorsalis pedis pulses are 2+ on the right side and 2+ on the left side.     Heart sounds: No murmur heard. Pulmonary:     Effort: Pulmonary effort is normal. No respiratory distress.     Breath sounds: Normal breath sounds.  Abdominal:     Palpations: Abdomen is soft. There is no hepatomegaly or mass.     Tenderness: There is no abdominal tenderness.  Musculoskeletal:     Right lower leg: No edema.     Left lower leg: No edema.  Lymphadenopathy:     Cervical: No cervical adenopathy.  Skin:    General: Skin is warm.     Findings:  No erythema or rash.  Neurological:     General: No focal deficit present.     Mental Status: He is alert and oriented to person, place, and time.     Gait: Gait normal.  Psychiatric:        Mood and Affect: Mood and affect normal.    ASSESSMENT AND PLAN:  Mr. Roman Jakubiak was seen today for medical management of chronic issues.  Diagnoses and all orders for this visit:  Orders Placed This Encounter  Procedures   Basic metabolic panel with GFR   Lipid panel   ALT   Essential hypertension Assessment & Plan: BP has improved, reporting similar readings at home. Continue amlodipine  5 mg daily and lisinopril -HCTZ 20-12.5 mg daily. Recommend low-salt/DASH diet. Continue monitoring BP at home. Eye exam is current. Follow-up in 01/2025 with his CPE, before if needed.  Orders: -     Lisinopril -hydroCHLOROthiazide ; Take 1 tablet by mouth daily.  Dispense: 90 tablet; Refill: 3 -     Basic metabolic panel with GFR; Future  Hyperlipidemia, unspecified hyperlipidemia type Assessment & Plan: He did not start atorvastatin  due to lack of insurance coverage. We discussed pharmacologic recommendations and LDL goals. Recommend rosuvastatin  10 mg daily. Continue low-fat diet. Fasting lipid panel and ALT in 2 to 3 months.  Orders: -     Rosuvastatin  Calcium ; Take 1 tablet (10 mg total) by mouth daily.  Dispense: 90 tablet; Refill: 3 -     Lipid  panel; Future -     ALT; Future   Return in about 9 months (around 01/16/2025) for CPE, chronic problems. Labs in 2-3 months..  Senta Kantor, MD [facility]. [place] [facility].      [1]  Current Outpatient Medications on File Prior to Visit  Medication Sig Dispense Refill   amLODipine  (NORVASC ) 5 MG tablet Take 1 tablet (5 mg total) by mouth daily. 90 tablet 3   cholecalciferol (VITAMIN D ) 400 units TABS tablet Take 400 Units by mouth daily.     diclofenac  (VOLTAREN ) 75 MG EC tablet Take 1 tablet (75 mg total) by mouth 2 (two) times daily. 60 tablet 2   lidocaine  (LIDODERM ) 5 % PLACE 1 PATCH ONTO THE SKIN DAILY. REMOVE & DISCARD PATCH WITHIN 12 HOURS OR AS DIRECTED BY MD 30 patch 1   vitamin B-12 (CYANOCOBALAMIN ) 100 MCG tablet Take 100 mcg by mouth daily.     No current facility-administered medications on file prior to visit.  [2] No Known Allergies  "

## 2024-04-21 NOTE — Patient Instructions (Addendum)
 A few things to remember from today's visit:  Essential hypertension  Hyperlipidemia, unspecified hyperlipidemia type - Plan: rosuvastatin  (CRESTOR ) 10 MG tablet  Rosuvastatin  10 mg diario. Labs in 2-3 meses.  If you need refills for medications you take chronically, please call your pharmacy. Do not use My Chart to request refills or for acute issues that need immediate attention. If you send a my chart message, it may take a few days to be addressed, specially if I am not in the office.  Please be sure medication list is accurate. If a new problem present, please set up appointment sooner than planned today.

## 2024-07-21 ENCOUNTER — Other Ambulatory Visit

## 2025-01-19 ENCOUNTER — Ambulatory Visit
